# Patient Record
Sex: Male | Born: 1950 | Race: Black or African American | Hispanic: No | Smoking: Former smoker
Health system: Southern US, Community
[De-identification: ages and names within clinical notes are randomized; demographics above are authoritative.]

## PROBLEM LIST (undated history)

## (undated) DIAGNOSIS — N189 Chronic kidney disease, unspecified: Secondary | ICD-10-CM

## (undated) DIAGNOSIS — J189 Pneumonia, unspecified organism: Secondary | ICD-10-CM

## (undated) DIAGNOSIS — E7800 Pure hypercholesterolemia, unspecified: Secondary | ICD-10-CM

## (undated) DIAGNOSIS — R0600 Dyspnea, unspecified: Secondary | ICD-10-CM

## (undated) DIAGNOSIS — R519 Headache, unspecified: Secondary | ICD-10-CM

## (undated) DIAGNOSIS — I10 Essential (primary) hypertension: Secondary | ICD-10-CM

## (undated) DIAGNOSIS — J449 Chronic obstructive pulmonary disease, unspecified: Secondary | ICD-10-CM

## (undated) DIAGNOSIS — M1990 Unspecified osteoarthritis, unspecified site: Secondary | ICD-10-CM

## (undated) DIAGNOSIS — E785 Hyperlipidemia, unspecified: Secondary | ICD-10-CM

## (undated) HISTORY — PX: OTHER SURGICAL HISTORY: SHX169

---

## 2018-03-14 ENCOUNTER — Encounter: Payer: Self-pay | Admitting: *Deleted

## 2018-03-17 ENCOUNTER — Ambulatory Visit: Payer: 59 | Admitting: Anesthesiology

## 2018-03-17 ENCOUNTER — Encounter
Admission: RE | Disposition: A | Discharge status: Home / Self Care | Payer: Self-pay | Source: Ambulatory Visit | Attending: Unknown Physician Specialty

## 2018-03-17 ENCOUNTER — Encounter: Payer: Self-pay | Admitting: Student

## 2018-03-17 ENCOUNTER — Ambulatory Visit
Admission: RE | Admit: 2018-03-17 | Discharge: 2018-03-17 | Disposition: A | Discharge status: Home / Self Care | Payer: 59 | Source: Ambulatory Visit | Attending: Unknown Physician Specialty | Admitting: Unknown Physician Specialty

## 2018-03-17 ENCOUNTER — Other Ambulatory Visit: Payer: Self-pay

## 2018-03-17 DIAGNOSIS — Z800 Family history of malignant neoplasm of digestive organs: Secondary | ICD-10-CM | POA: Diagnosis not present

## 2018-03-17 DIAGNOSIS — K640 First degree hemorrhoids: Secondary | ICD-10-CM | POA: Insufficient documentation

## 2018-03-17 DIAGNOSIS — Z1211 Encounter for screening for malignant neoplasm of colon: Secondary | ICD-10-CM | POA: Diagnosis not present

## 2018-03-17 DIAGNOSIS — E785 Hyperlipidemia, unspecified: Secondary | ICD-10-CM | POA: Diagnosis not present

## 2018-03-17 DIAGNOSIS — K635 Polyp of colon: Secondary | ICD-10-CM | POA: Insufficient documentation

## 2018-03-17 DIAGNOSIS — Z7951 Long term (current) use of inhaled steroids: Secondary | ICD-10-CM | POA: Insufficient documentation

## 2018-03-17 DIAGNOSIS — D123 Benign neoplasm of transverse colon: Secondary | ICD-10-CM | POA: Diagnosis not present

## 2018-03-17 DIAGNOSIS — F17210 Nicotine dependence, cigarettes, uncomplicated: Secondary | ICD-10-CM | POA: Insufficient documentation

## 2018-03-17 DIAGNOSIS — D122 Benign neoplasm of ascending colon: Secondary | ICD-10-CM | POA: Diagnosis not present

## 2018-03-17 HISTORY — DX: Hyperlipidemia, unspecified: E78.5

## 2018-03-17 HISTORY — PX: COLONOSCOPY WITH PROPOFOL: SHX5780

## 2018-03-17 LAB — URINE DRUG SCREEN, QUALITATIVE (ARMC ONLY)
Amphetamines, Ur Screen: NOT DETECTED
Benzodiazepine, Ur Scrn: NOT DETECTED
Cannabinoid 50 Ng, Ur ~~LOC~~: POSITIVE — AB
Cocaine Metabolite,Ur ~~LOC~~: NOT DETECTED
MDMA (Ecstasy)Ur Screen: NOT DETECTED
Methadone Scn, Ur: NOT DETECTED
Opiate, Ur Screen: NOT DETECTED
PHENCYCLIDINE (PCP) UR S: NOT DETECTED
Tricyclic, Ur Screen: NOT DETECTED

## 2018-03-17 SURGERY — COLONOSCOPY WITH PROPOFOL
Anesthesia: General

## 2018-03-17 MED ORDER — MIDAZOLAM HCL 5 MG/5ML IJ SOLN
INTRAMUSCULAR | Status: DC | PRN
  Administered 2018-03-17: 2 mg via INTRAVENOUS

## 2018-03-17 MED ORDER — PROPOFOL 500 MG/50ML IV EMUL
INTRAVENOUS | Status: AC
Start: 2018-03-17 — End: ?
  Filled 2018-03-17: qty 50

## 2018-03-17 MED ORDER — SODIUM CHLORIDE 0.9 % IV SOLN
INTRAVENOUS | Status: DC
  Administered 2018-03-17: 10:00:00 via INTRAVENOUS

## 2018-03-17 MED ORDER — FENTANYL CITRATE (PF) 100 MCG/2ML IJ SOLN
INTRAMUSCULAR | Status: AC
  Filled 2018-03-17: qty 2

## 2018-03-17 MED ORDER — LIDOCAINE HCL (PF) 2 % IJ SOLN
INTRAMUSCULAR | Status: DC | PRN
  Administered 2018-03-17: 80 mg

## 2018-03-17 MED ORDER — SODIUM CHLORIDE 0.9 % IV SOLN: INTRAVENOUS | Status: DC

## 2018-03-17 MED ORDER — LIDOCAINE HCL (PF) 2 % IJ SOLN
INTRAMUSCULAR | Status: AC
  Filled 2018-03-17: qty 10

## 2018-03-17 MED ORDER — FENTANYL CITRATE (PF) 100 MCG/2ML IJ SOLN
INTRAMUSCULAR | Status: DC | PRN
  Administered 2018-03-17 (×2): 25 ug via INTRAVENOUS
  Administered 2018-03-17: 50 ug via INTRAVENOUS

## 2018-03-17 MED ORDER — PROPOFOL 10 MG/ML IV BOLUS
INTRAVENOUS | Status: DC | PRN
  Administered 2018-03-17: 20 mg via INTRAVENOUS
  Administered 2018-03-17: 30 mg via INTRAVENOUS
  Administered 2018-03-17: 20 mg via INTRAVENOUS

## 2018-03-17 MED ORDER — MIDAZOLAM HCL 2 MG/2ML IJ SOLN
INTRAMUSCULAR | Status: AC
  Filled 2018-03-17: qty 2

## 2018-03-17 MED ORDER — PROPOFOL 500 MG/50ML IV EMUL
INTRAVENOUS | Status: DC | PRN
  Administered 2018-03-17: 50 ug/kg/min via INTRAVENOUS

## 2018-03-17 NOTE — H&P (Signed)
Primary Care Physician:  Antionette Char, MD Primary Gastroenterologist:  Dr. Vira Agar  Pre-Procedure History & Physical: HPI:  Melvin Carney is a 67 y.o. male is here for an colonoscopy.  For family history of colon cancer in sister.   Past Medical History:  Diagnosis Date  . Hyperlipidemia     Past Surgical History:  Procedure Laterality Date  . Right Knee Ligament Repair      Prior to Admission medications   Medication Sig Start Date End Date Taking? Authorizing Provider  fluticasone (FLONASE) 50 MCG/ACT nasal spray Place into both nostrils daily.   Yes [provider]  pravastatin (PRAVACHOL) 40 MG tablet Take 40 mg by mouth daily.   Yes [provider]    Allergies as of 02/06/2018  . (Not on File)    History reviewed. No pertinent family history.  Social History   Socioeconomic History  . Marital status: Married    Spouse name: Not on file  . Number of children: Not on file  . Years of education: Not on file  . Highest education level: Not on file  Occupational History  . Not on file  Social Needs  . Financial resource strain: Not on file  . Food insecurity:    Worry: Not on file    Inability: Not on file  . Transportation needs:    Medical: Not on file    Non-medical: Not on file  Tobacco Use  . Smoking status: Current Every Day Smoker    Packs/day: 0.50    Years: 50.00    Pack years: 25.00    Types: Cigarettes  . Smokeless tobacco: Never Used  Substance and Sexual Activity  . Alcohol use: Yes    Comment: 7.2 oz of  alcohol/week  . Drug use: Yes    Types: Marijuana  . Sexual activity: Not on file  Lifestyle  . Physical activity:    Days per week: Not on file    Minutes per session: Not on file  . Stress: Not on file  Relationships  . Social connections:    Talks on phone: Not on file    Gets together: Not on file    Attends religious service: Not on file    Active member of club or organization: Not on file    Attends  meetings of clubs or organizations: Not on file    Relationship status: Not on file  . Intimate partner violence:    Fear of current or ex partner: Not on file    Emotionally abused: Not on file    Physically abused: Not on file    Forced sexual activity: Not on file  Other Topics Concern  . Not on file  Social History Narrative  . Not on file    Review of Systems: See HPI, otherwise negative ROS  Physical Exam: BP (!) 137/91   Pulse 64   Temp 97.8 F (36.6 C) (Tympanic)   Resp 16   Ht 5\' 9"  (1.753 m)   Wt 73.5 kg (162 lb)   SpO2 98%   BMI 23.92 kg/m  General:   Alert,  pleasant and cooperative in NAD Head:  Normocephalic and atraumatic. Neck:  Supple; no masses or thyromegaly. Lungs:  Clear throughout to auscultation.    Heart:  Regular rate and rhythm. Abdomen:  Soft, nontender and nondistended. Normal bowel sounds, without guarding, and without rebound.   Neurologic:  Alert and  oriented x4;  grossly normal neurologically.  Impression/Plan: Graylon Good  is here for an colonoscopy to be performed for FH colon cancer is sister.  Risks, benefits, limitations, and alternatives regarding  colonoscopy have been reviewed with the patient.  Questions have been answered.  All parties agreeable.   Gaylyn Cheers, MD  03/17/2018, 10:26 AM

## 2018-03-17 NOTE — Anesthesia Post-op Follow-up Note (Signed)
Anesthesia QCDR form completed.        

## 2018-03-17 NOTE — Op Note (Signed)
[facility] Gastroenterology Patient Name: Melvin Carney Procedure Date: 03/17/2018 10:03 AM MRN: [id number] Account #: [id number] Date of Birth: 12-16-1950 Admit Type: Outpatient Age: 67 Room: [facility] ENDO ROOM 3 Gender: Male Note Status: Finalized Procedure:            Colonoscopy Indications:          Screening in patient at increased risk: Family history                        of 1st-degree relative with colorectal cancer Providers:            Manya Silvas, MD Medicines:            Propofol per Anesthesia Complications:        No immediate complications. Procedure:            Pre-Anesthesia Assessment:                       - After reviewing the risks and benefits, the patient                        was deemed in satisfactory condition to undergo the                        procedure.                       - After reviewing the risks and benefits, the patient                        was deemed in satisfactory condition to undergo the                        procedure.                       After obtaining informed consent, the colonoscope was                        passed under direct vision. Throughout the procedure,                        the patient's blood pressure, pulse, and oxygen                        saturations were monitored continuously. The was                        introduced through the anus and advanced to the the                        cecum, identified by appendiceal orifice and ileocecal                        valve. The colonoscopy was performed without                        difficulty. The patient tolerated the procedure well.                        The quality of the bowel preparation was excellent. Findings:  Two sessile polyps were found in the ascending colon. The polyps were       diminutive in size. These polyps were removed with a jumbo cold forceps.       Resection and retrieval were complete.      A diminutive polyp was  found in the hepatic flexure. The polyp was       sessile. The polyp was removed with a jumbo cold forceps. Resection and       retrieval were complete.      Two sessile polyps were found in the hepatic flexure. The polyps were       diminutive in size. These polyps were removed with a hot snare.       Resection and retrieval were complete.      Three sessile polyps were found in the recto-sigmoid colon. The polyps       were diminutive in size. These polyps were removed with a jumbo cold       forceps. Resection and retrieval were complete.      Internal hemorrhoids were found during endoscopy. The hemorrhoids were       small and Grade I (internal hemorrhoids that do not prolapse).      The exam was otherwise without abnormality. Impression:           - Two diminutive polyps in the ascending colon, removed                        with a jumbo cold forceps. Resected and retrieved.                       - One diminutive polyp at the hepatic flexure, removed                        with a jumbo cold forceps. Resected and retrieved.                       - Two diminutive polyps at the hepatic flexure, removed                        with a hot snare. Resected and retrieved.                       - Three diminutive polyps at the recto-sigmoid colon,                        removed with a jumbo cold forceps. Resected and                        retrieved.                       - Internal hemorrhoids.                       - The examination was otherwise normal. Recommendation:       - Await pathology results. Manya Silvas, MD 03/17/2018 11:12:09 AM This report has been signed electronically. Number of Addenda: 0 Note Initiated On: 03/17/2018 10:03 AM Scope Withdrawal Time: 0 hours 26 minutes 35 seconds  Total Procedure Duration: 0 hours 30 minutes 31 seconds       [facility]

## 2018-03-17 NOTE — Transfer of Care (Signed)
Immediate Anesthesia Transfer of Care Note  Patient: Melvin Carney  Procedure(s) Performed: COLONOSCOPY WITH PROPOFOL (N/A )  Patient Location: PACU  Anesthesia Type:General  Level of Consciousness: sedated  Airway & Oxygen Therapy: Patient Spontanous Breathing and Patient connected to nasal cannula oxygen  Post-op Assessment: Report given to RN and Post -op Vital signs reviewed and stable  Post vital signs: Reviewed and stable  Last Vitals:  Vitals Value Taken Time  BP    Temp    Pulse 58 03/17/2018 11:09 AM  Resp 20 03/17/2018 11:09 AM  SpO2 100 % 03/17/2018 11:09 AM  Vitals shown include unvalidated device data.  Last Pain:  Vitals:   03/17/18 0938  TempSrc: Tympanic  PainSc: 0-No pain         Complications: No apparent anesthesia complications

## 2018-03-17 NOTE — Anesthesia Postprocedure Evaluation (Signed)
Anesthesia Post Note  Patient: Melvin Carney  Procedure(s) Performed: COLONOSCOPY WITH PROPOFOL (N/A )  Patient location during evaluation: Endoscopy Anesthesia Type: General Level of consciousness: awake and alert Pain management: pain level controlled Vital Signs Assessment: post-procedure vital signs reviewed and stable Respiratory status: spontaneous breathing, nonlabored ventilation, respiratory function stable and patient connected to nasal cannula oxygen Cardiovascular status: blood pressure returned to baseline and stable Postop Assessment: no apparent nausea or vomiting Anesthetic complications: no     Last Vitals:  Vitals:   03/17/18 1130 03/17/18 1140  BP: (!) 150/90 (!) 149/81  Pulse: (!) 54 (!) 54  Resp: 20 (!) 22  Temp:    SpO2: 100% 100%    Last Pain:  Vitals:   03/17/18 1100  TempSrc: Tympanic  PainSc:                  Martha Clan

## 2018-03-17 NOTE — Anesthesia Preprocedure Evaluation (Signed)
Anesthesia Evaluation  Patient identified by MRN, date of birth, ID band Patient awake    Reviewed: Allergy & Precautions, H&P , NPO status , Patient's Chart, lab work & pertinent test results, reviewed documented beta blocker date and time   History of Anesthesia Complications Negative for: history of anesthetic complications  Airway Mallampati: I  TM Distance: >3 FB Neck ROM: full    Dental  (+) Edentulous Upper, Edentulous Lower, Dental Advidsory Given   Pulmonary neg shortness of breath, neg sleep apnea, neg COPD, neg recent URI, Current Smoker,           Cardiovascular Exercise Tolerance: Good negative cardio ROS       Neuro/Psych negative neurological ROS  negative psych ROS   GI/Hepatic negative GI ROS, Neg liver ROS,   Endo/Other  negative endocrine ROS  Renal/GU negative Renal ROS  negative genitourinary   Musculoskeletal   Abdominal   Peds  Hematology negative hematology ROS (+)   Anesthesia Other Findings Past Medical History: No date: Hyperlipidemia   Reproductive/Obstetrics negative OB ROS                             Anesthesia Physical Anesthesia Plan  ASA: II  Anesthesia Plan: General   Post-op Pain Management:    Induction: Intravenous  PONV Risk Score and Plan: 1 and Propofol infusion  Airway Management Planned: Nasal Cannula  Additional Equipment:   Intra-op Plan:   Post-operative Plan:   Informed Consent: I have reviewed the patients History and Physical, chart, labs and discussed the procedure including the risks, benefits and alternatives for the proposed anesthesia with the patient or authorized representative who has indicated his/her understanding and acceptance.   Dental Advisory Given  Plan Discussed with: Anesthesiologist, CRNA and Surgeon  Anesthesia Plan Comments:         Anesthesia Quick Evaluation

## 2018-03-18 LAB — SURGICAL PATHOLOGY

## 2018-03-19 ENCOUNTER — Encounter: Payer: Self-pay | Admitting: Unknown Physician Specialty

## 2020-01-28 ENCOUNTER — Other Ambulatory Visit (HOSPITAL_COMMUNITY): Payer: Self-pay | Admitting: Family Medicine

## 2020-01-28 DIAGNOSIS — Z122 Encounter for screening for malignant neoplasm of respiratory organs: Secondary | ICD-10-CM

## 2020-01-28 DIAGNOSIS — Z720 Tobacco use: Secondary | ICD-10-CM

## 2020-05-02 ENCOUNTER — Ambulatory Visit (HOSPITAL_COMMUNITY)
Admission: RE | Admit: 2020-05-02 | Discharge: 2020-05-02 | Disposition: A | Discharge status: Home / Self Care | Payer: 59 | Source: Ambulatory Visit | Attending: Family Medicine | Admitting: Family Medicine

## 2020-05-02 ENCOUNTER — Other Ambulatory Visit: Payer: Self-pay

## 2020-05-02 DIAGNOSIS — Z720 Tobacco use: Secondary | ICD-10-CM | POA: Diagnosis present

## 2020-05-02 DIAGNOSIS — Z122 Encounter for screening for malignant neoplasm of respiratory organs: Secondary | ICD-10-CM | POA: Diagnosis not present

## 2020-09-06 ENCOUNTER — Ambulatory Visit: Payer: 59 | Attending: Family Medicine

## 2020-09-06 ENCOUNTER — Other Ambulatory Visit: Payer: Self-pay

## 2020-09-06 VITALS — BP 141/86

## 2020-09-06 DIAGNOSIS — G8929 Other chronic pain: Secondary | ICD-10-CM | POA: Diagnosis present

## 2020-09-06 DIAGNOSIS — M25512 Pain in left shoulder: Secondary | ICD-10-CM | POA: Diagnosis not present

## 2020-09-06 DIAGNOSIS — M25511 Pain in right shoulder: Secondary | ICD-10-CM | POA: Insufficient documentation

## 2020-09-06 NOTE — Therapy (Signed)
[facility] [address] [place], [address] Phone: [phone]   Fax:  [phone]  Physical Therapy Evaluation  Patient Details  Name: Melvin Carney MRN: [id number] Date of Birth: 1951/04/01 Referring Provider (PT): Curtis Sites MD    Encounter Date: 09/06/2020   PT End of Session - 09/06/20 1100    Visit Number 1    Number of Visits 16    Date for PT Re-Evaluation 11/29/20    Authorization Type [employer] MCR    Authorization Time Period 09/06/20-11/29/20    PT Start Time 1000    PT Stop Time 1058    PT Time Calculation (min) 58 min    Activity Tolerance Patient limited by pain;Patient tolerated treatment well;Other (comment)   kinesiophobia   Behavior During Therapy WFL for tasks assessed/performed           Past Medical History:  Diagnosis Date  . Hyperlipidemia     Past Surgical History:  Procedure Laterality Date  . COLONOSCOPY WITH PROPOFOL N/A 03/17/2018   Procedure: COLONOSCOPY WITH PROPOFOL;  Surgeon: Manya Silvas, MD;  Location: [facility] ENDOSCOPY;  Service: Endoscopy;  Laterality: N/A;  . Right Knee Ligament Repair      Vitals:   09/06/20 1000  BP: (!) 141/86  SpO2: 100%      Subjective Assessment - 09/06/20 1001    Subjective Melvin Carney comes to OPPT for bilateral shoulder pain on-going for 2-3 years, progressively worse. Pt reports pain, weakness, and limited ROM. Pt denies any imaging, never seen by orthopedics. Pain at eval includes: Rt shoulder 9/10; Left shoulder 7/10. Prescribed pain meds don't work; tylenol and ibuprofen have never helped. Pt reports some paresthesias in right hand that a started recently, lasts about 5 minutes. Pt denies any frank weakness of the hands, denies LUE paresthesias.    Pertinent History Smoker    Limitations Other (comment)   sleeping   How long can you sit comfortably? No related    How long can you stand comfortably? No related    How  long can you walk comfortably? No related    Diagnostic tests None    Patient Stated Goals resolve pain, return to lifting things for IADL/work    Currently in Pain? Yes    Pain Score 8     Pain Location Shoulder    Pain Orientation Right    Pain Type Chronic pain    Pain Frequency Constant    Multiple Pain Sites Yes    Pain Score 7    Pain Location Shoulder    Pain Orientation Left    Pain Type Chronic pain              [facility] PT Assessment - 09/06/20 0001      Assessment   Medical Diagnosis Bilat shoulder pain/stiffness    Referring Provider (PT) Curtis Sites MD     Onset Date/Surgical Date --   ~2-3 years ago;    Hand Dominance Right    Prior Therapy None      Precautions   Precautions None      Balance Screen   Has the patient fallen in the past 6 months No    Has the patient had a decrease in activity level because of a fear of falling?  No    Is the patient reluctant to leave their home because of a fear of falling?  No      Home Environment  Living Environment Private residence    Living Arrangements Alone    Available Help at Discharge --   Mother in [place]; Sister in [place]; Sister in [place] to enter    Entrance Stairs-Number of Steps 4    Entrance Stairs-Rails Right    [place] One level      Prior Function   Level of Independence Independent    Vocation Part time employment   seasonal employeement at the farm   Leisure Likes to travel, go to [place] to visit family      Observation/Other Assessments   Focus on Therapeutic Outcomes (FOTO)  57      ROM / Strength   AROM / PROM / Strength AROM;PROM;Strength      AROM   AROM Assessment Site Shoulder   Pain with all movements, mostly at end range   Right/Left Shoulder Right;Left    Right Shoulder Flexion 96 Degrees    Right Shoulder ABduction 76 Degrees    Right Shoulder Internal Rotation --   T11   Right Shoulder External Rotation --    C7   Left Shoulder Flexion 97 Degrees    Left Shoulder ABduction 86 Degrees    Left Shoulder Internal Rotation --   T9   Left Shoulder External Rotation --   C7     PROM   PROM Assessment Site Shoulder;Cervical    Right/Left Shoulder Right;Left    Right Shoulder Flexion 115 Degrees    Right Shoulder ABduction 103 Degrees    Right Shoulder Internal Rotation 30 Degrees    Right Shoulder External Rotation 60 Degrees    Left Shoulder Flexion 151 Degrees    Left Shoulder ABduction 140 Degrees    Left Shoulder Internal Rotation 40 Degrees   pain at end range    Left Shoulder External Rotation 80 Degrees    Cervical Extension 30    Cervical - Right Rotation 51   shoulder pain   Cervical - Left Rotation 50   shoulder pain     Strength   Overall Strength Unable to assess   too much pain/guarding   Overall Strength Comments Elbows 5/5Lt; 4+/5 Rt; grips WNL; shoulder ER: 5/5 Lt4/5 Rt    Strength Assessment Site Shoulder    Right/Left Shoulder Right;Left            Objective measurements completed on examination: See above findings.       [facility] Adult PT Treatment/Exercise - 09/06/20 0001      Exercises   Exercises Shoulder      Shoulder Exercises: Seated   Elevation AROM;10 reps;Both    Retraction AROM;Both;10 reps      Shoulder Exercises: ROM/Strengthening   Other ROM/Strengthening Exercises Flexion table slides 1x20 bilat     Other ROM/Strengthening Exercises ABDCT walkouts with T-Rex straps 20x Left; 15x Right                     PT Education - 09/06/20 1059    Education Details Explained how current kinesiophobia is first hurdle in rehab process.    Person(s) Educated Patient    Methods Explanation    Comprehension Verbalized understanding;Need further instruction            PT Short Term Goals - 09/06/20 1115      PT SHORT TERM GOAL #1   Title Pt will report decreased worse pain in bilat shoulder <7/10 bilat.  Baseline AT eval 9/10 Rt; 7/10 Lt     Time 4    Period Weeks    Status New    Target Date 10/04/20      PT SHORT TERM GOAL #2   Title Pt to demontrate improved functional activity tolerance AEB FOTO> 60.    Time 4    Period Weeks    Status New    Target Date 10/04/20             PT Long Term Goals - 09/06/20 1117      PT LONG TERM GOAL #1   Title Pt to demonstrate improved functional activity tolerance AED FOTO>65.    Time 8    Period Weeks    Status New    Target Date 11/01/20      PT LONG TERM GOAL #2   Title Pt to demonstrate improved aggregate shoulder P/ROM >375 degrees Rt; 425 degrees left to improve tolerance to dressing and housework.    Baseline (Flexion+Abduction+ER+IR) At eval: Rt 308 degrees; Lt 411 degrees.    Time 8    Period Weeks    Status New    Target Date 11/01/20      PT LONG TERM GOAL #3   Title Pt to demonstrate improved aggregate shoulder P/ROM >400 degrees Rt; 440 degrees left to improve tolerance to dressing and housework.    Baseline (Flexion+Abduction+ER+IR) At eval: Rt 308 degrees; Lt 411 degrees.    Time 12    Period Weeks    Status New    Target Date 11/29/20      PT LONG TERM GOAL #4   Title Pt to report/demonstrate ability to lift and carry 25lb item over 193ft 10x with breaks as needed to demonstrate ability to grocery shop, perform farm work tasks.    Baseline unable to lift/carry at eval    Time 12    Period Weeks    Status New    Target Date 11/29/20                  Plan - 09/06/20 1104    Clinical Impression Statement Melvin Carney is a 89yoM presenting to OPPT referred by PCP for chronic progressive bilat shoulder pain. Pt has been having difficulty with ADL performance and lifting objects at home/work, significant pain sleeping. Examination reveals severe ROM restriction bilaterally, significant guarding and kinesiophobia, end-range pain. Pt also has limited cervical ROM with causes pain in UT bilat, but head position does not change pain symptoms in  BUE during ROM testing. Pt will benefit from skilled PT intervention to address pain, ROM restriction, weakness in order to restore    Personal Factors and Comorbidities Age;Behavior Pattern;Comorbidity 1;Education;Time since onset of injury/illness/exacerbation;Social Background    Comorbidities Smoker    Examination-Activity Limitations Bathing;Reach Overhead;Carry;Dressing;Hygiene/Grooming    Examination-Participation Restrictions Cleaning;Driving;Yard Work;Occupation    Stability/Clinical Decision Making Stable/Uncomplicated    Clinical Decision Making Moderate    Rehab Potential Fair    PT Frequency 2x / week    PT Duration 12 weeks    PT Treatment/Interventions Electrical Stimulation;Moist Heat;Therapeutic activities;Therapeutic exercise;Neuromuscular re-education;Manual techniques;Patient/family education;Passive range of motion;Dry needling;Joint Manipulations    PT Next Visit Plan Review HEP, add doorframe 4-way shoulder isometrics to HEP    PT Home Exercise Plan At Eval: flexion table slides bilat; scapular elevation, scapular retraction (all TID)    Consulted and Agree with Plan of Care Patient           Patient will benefit  from skilled therapeutic intervention in order to improve the following deficits and impairments:  Decreased range of motion, Decreased endurance, Decreased activity tolerance, Decreased knowledge of precautions, Decreased balance, Decreased knowledge of use of DME, Decreased cognition, Decreased mobility, Decreased strength, Postural dysfunction, Impaired flexibility, Hypomobility, Impaired perceived functional ability, Impaired UE functional use  Visit Diagnosis: Chronic left shoulder pain  Chronic right shoulder pain     Problem List There are no problems to display for this patient. 11:33 AM, 09/06/20 Melvin Carney, PT, DPT Physical Therapist - [place] [phone] (Office)    Gautam Langhorst C 09/06/2020, 11:32 AM  [facility] [address] [place], [address] Phone: [phone]   Fax:  [phone]  Name: Dorthy Hustead MRN: [id number] Date of Birth: [birth year]

## 2020-09-08 ENCOUNTER — Ambulatory Visit: Payer: 59

## 2020-09-08 ENCOUNTER — Other Ambulatory Visit: Payer: Self-pay

## 2020-09-08 DIAGNOSIS — M25512 Pain in left shoulder: Secondary | ICD-10-CM | POA: Diagnosis not present

## 2020-09-08 DIAGNOSIS — G8929 Other chronic pain: Secondary | ICD-10-CM

## 2020-09-08 NOTE — Therapy (Signed)
[facility] [address] [place], [address] Phone: [phone]   Fax:  [phone]  Physical Therapy Treatment  Patient Details  Name: Melvin Carney MRN: [id number] Date of Birth: [birth year] Referring Provider (PT): Curtis Sites MD    Encounter Date: 09/08/2020   PT End of Session - 09/08/20 0958    Visit Number 2    Number of Visits 16    Date for PT Re-Evaluation 11/29/20    Authorization Type UHC MCR    Authorization Time Period 09/06/20-11/29/20    PT Start Time 0815    PT Stop Time 0900    PT Time Calculation (min) 45 min    Activity Tolerance Patient limited by pain;Patient tolerated treatment well;Other (comment)   kinesiophobia   Behavior During Therapy WFL for tasks assessed/performed           Past Medical History:  Diagnosis Date  . Hyperlipidemia     Past Surgical History:  Procedure Laterality Date  . COLONOSCOPY WITH PROPOFOL N/A 03/17/2018   Procedure: COLONOSCOPY WITH PROPOFOL;  Surgeon: Manya Silvas, MD;  Location: [facility] ENDOSCOPY;  Service: Endoscopy;  Laterality: N/A;  . Right Knee Ligament Repair      There were no vitals filed for this visit.   Subjective Assessment - 09/08/20 0839    Subjective Patient reports no major change since the previous session. Patient states he's been performing his exercises.    Pertinent History Smoker    Limitations Other (comment)   sleeping   How long can you sit comfortably? No related    How long can you stand comfortably? No related    How long can you walk comfortably? No related    Diagnostic tests None    Patient Stated Goals resolve pain, return to lifting things for IADL/work    Currently in Pain? Yes    Pain Score 8     Pain Location Shoulder    Pain Orientation Right    Pain Descriptors / Indicators Aching    Pain Type Chronic pain    Pain Onset More than a month ago    Pain Frequency Intermittent                   TREATMENT Therapeutic Exercise Scapular retraction with OP from therapist in sitting - 2 x 10  Shoulder depression in sitting - x 10  Rows Shoulder flexion - x 10 on L side Shoulder flexion press in sitting--x 10  Shoulder flexion AROM to 90 with dowel in supine - x 10  Hands clasped together shoulder flexion to 90 degrees - x 10   Performed exercises to decrease fear with movement and improve strength      PT Education - 09/08/20 0841    Education Details Rows in sitting with YTB; form/technique with exercise    Person(s) Educated Patient    Methods Explanation;Demonstration    Comprehension Verbalized understanding;Returned demonstration            PT Short Term Goals - 09/06/20 1115      PT SHORT TERM GOAL #1   Title Pt will report decreased worse pain in bilat shoulder <7/10 bilat.    Baseline AT eval 9/10 Rt; 7/10 Lt    Time 4    Period Weeks    Status New    Target Date 10/04/20      PT SHORT TERM GOAL #2   Title Pt to demontrate improved functional  activity tolerance AEB FOTO> 60.    Time 4    Period Weeks    Status New    Target Date 10/04/20             PT Long Term Goals - 09/06/20 1117      PT LONG TERM GOAL #1   Title Pt to demonstrate improved functional activity tolerance AED FOTO>65.    Time 8    Period Weeks    Status New    Target Date 11/01/20      PT LONG TERM GOAL #2   Title Pt to demonstrate improved aggregate shoulder P/ROM >375 degrees Rt; 425 degrees left to improve tolerance to dressing and housework.    Baseline (Flexion+Abduction+ER+IR) At eval: Rt 308 degrees; Lt 411 degrees.    Time 8    Period Weeks    Status New    Target Date 11/01/20      PT LONG TERM GOAL #3   Title Pt to demonstrate improved aggregate shoulder P/ROM >400 degrees Rt; 440 degrees left to improve tolerance to dressing and housework.    Baseline (Flexion+Abduction+ER+IR) At eval: Rt 308 degrees; Lt 411 degrees.    Time 12    Period  Weeks    Status New    Target Date 11/29/20      PT LONG TERM GOAL #4   Title Pt to report/demonstrate ability to lift and carry 25lb item over 147ft 10x with breaks as needed to demonstrate ability to grocery shop, perform farm work tasks.    Baseline unable to lift/carry at eval    Time 12    Period Weeks    Status New    Target Date 11/29/20                 Plan - 09/08/20 6967    Clinical Impression Statement Patient with increased fear of movement especially with shoulder flexion and scapular retraction/rows with exercises performed. Patient able to performshoulder flexion to 150 with shoulder press, a signficant increase compared to an erect UE. This indicates Kinesiophobia as well as a decrease in strength. Patient will benefit from further skilled therapy to return to prior level of function.    Personal Factors and Comorbidities Age;Behavior Pattern;Comorbidity 1;Education;Time since onset of injury/illness/exacerbation;Social Background    Comorbidities Smoker    Examination-Activity Limitations Bathing;Reach Overhead;Carry;Dressing;Hygiene/Grooming    Examination-Participation Restrictions Cleaning;Driving;Yard Work;Occupation    Stability/Clinical Decision Making Stable/Uncomplicated    Rehab Potential Fair    PT Frequency 2x / week    PT Duration 12 weeks    PT Treatment/Interventions Electrical Stimulation;Moist Heat;Therapeutic activities;Therapeutic exercise;Neuromuscular re-education;Manual techniques;Patient/family education;Passive range of motion;Dry needling;Joint Manipulations    PT Next Visit Plan Review HEP, add doorframe 4-way shoulder isometrics to HEP    PT Home Exercise Plan At Eval: flexion table slides bilat; scapular elevation, scapular retraction (all TID)    Consulted and Agree with Plan of Care Patient           Patient will benefit from skilled therapeutic intervention in order to improve the following deficits and impairments:  Decreased  range of motion,Decreased endurance,Decreased activity tolerance,Decreased knowledge of precautions,Decreased balance,Decreased knowledge of use of DME,Decreased cognition,Decreased mobility,Decreased strength,Postural dysfunction,Impaired flexibility,Hypomobility,Impaired perceived functional ability,Impaired UE functional use  Visit Diagnosis: Chronic left shoulder pain  Chronic right shoulder pain     Problem List There are no problems to display for this patient.   Blythe Stanford, PT DPT 09/08/2020, 10:04 AM  [facility] [address], [place], [address] Phone: [phone]   Fax:  [phone]  Name: Garin Mata MRN: [id number] Date of Birth: 1951/01/15

## 2020-09-13 ENCOUNTER — Other Ambulatory Visit: Payer: Self-pay

## 2020-09-13 ENCOUNTER — Ambulatory Visit: Payer: 59

## 2020-09-13 DIAGNOSIS — M25511 Pain in right shoulder: Secondary | ICD-10-CM

## 2020-09-13 DIAGNOSIS — G8929 Other chronic pain: Secondary | ICD-10-CM

## 2020-09-13 DIAGNOSIS — M25512 Pain in left shoulder: Secondary | ICD-10-CM | POA: Diagnosis not present

## 2020-09-13 NOTE — Therapy (Signed)
[facility] [address] [place], [address] Phone: [phone]   Fax:  [phone]  Physical Therapy Treatment  Patient Details  Name: Melvin Carney MRN: [id number] Date of Birth: 03/05/1951 Referring Provider (PT): Curtis Sites MD    Encounter Date: 09/13/2020   PT End of Session - 09/13/20 0943    Visit Number 3    Number of Visits 16    Date for PT Re-Evaluation 11/29/20    Authorization Type UHC MCR    Authorization Time Period 09/06/20-11/29/20    PT Start Time 0900    PT Stop Time 0938    PT Time Calculation (min) 38 min    Activity Tolerance Patient limited by pain;Patient tolerated treatment well;Other (comment)   kinesiophobia   Behavior During Therapy WFL for tasks assessed/performed           Past Medical History:  Diagnosis Date   Hyperlipidemia     Past Surgical History:  Procedure Laterality Date   COLONOSCOPY WITH PROPOFOL N/A 03/17/2018   Procedure: COLONOSCOPY WITH PROPOFOL;  Surgeon: Manya Silvas, MD;  Location: [facility] ENDOSCOPY;  Service: Endoscopy;  Laterality: N/A;   Right Knee Ligament Repair      There were no vitals filed for this visit.   Subjective Assessment - 09/13/20 0905    Subjective Patient states his pain in his shoulder is improving and isable to move it more.    Pertinent History Smoker    Limitations Other (comment)   sleeping   How long can you sit comfortably? No related    How long can you stand comfortably? No related    How long can you walk comfortably? No related    Diagnostic tests None    Patient Stated Goals resolve pain, return to lifting things for IADL/work    Currently in Pain? Yes    Pain Score 5     Pain Location Shoulder    Pain Orientation Right    Pain Descriptors / Indicators Aching    Pain Type Chronic pain    Pain Onset More than a month ago    Pain Frequency Intermittent              TREATMENT Therapeutic  Exercise  Shoulder Dowel lifts overhead with B UEs - 2 x 10 Standing shoulder ER with YTB - 2 x 10  Shoulder extension in standing with YTB and retraction - 2 x 10  Shoulder presses in standing with 1# B - 2 x 10 ULTT1 - x 5 with OP glides Median nerve glides - 2 x 10  Push ups on wall - 2 x 10  Standing shoulder IR in standing with YTB - x 20   Performed exercises to improve strength and endurance    PT Education - 09/13/20 0931    Education Details Form/technique with exercise; added shoulder extension, B shoulder ER, and median nerve glides to HEP    Person(s) Educated Patient    Methods Explanation;Demonstration    Comprehension Verbalized understanding;Returned demonstration            PT Short Term Goals - 09/06/20 1115      PT SHORT TERM GOAL #1   Title Pt will report decreased worse pain in bilat shoulder <7/10 bilat.    Baseline AT eval 9/10 Rt; 7/10 Lt    Time 4    Period Weeks    Status New    Target Date 10/04/20  PT SHORT TERM GOAL #2   Title Pt to demontrate improved functional activity tolerance AEB FOTO> 60.    Time 4    Period Weeks    Status New    Target Date 10/04/20             PT Long Term Goals - 09/06/20 1117      PT LONG TERM GOAL #1   Title Pt to demonstrate improved functional activity tolerance AED FOTO>65.    Time 8    Period Weeks    Status New    Target Date 11/01/20      PT LONG TERM GOAL #2   Title Pt to demonstrate improved aggregate shoulder P/ROM >375 degrees Rt; 425 degrees left to improve tolerance to dressing and housework.    Baseline (Flexion+Abduction+ER+IR) At eval: Rt 308 degrees; Lt 411 degrees.    Time 8    Period Weeks    Status New    Target Date 11/01/20      PT LONG TERM GOAL #3   Title Pt to demonstrate improved aggregate shoulder P/ROM >400 degrees Rt; 440 degrees left to improve tolerance to dressing and housework.    Baseline (Flexion+Abduction+ER+IR) At eval: Rt 308 degrees; Lt 411 degrees.     Time 12    Period Weeks    Status New    Target Date 11/29/20      PT LONG TERM GOAL #4   Title Pt to report/demonstrate ability to lift and carry 25lb item over 145ft 10x with breaks as needed to demonstrate ability to grocery shop, perform farm work tasks.    Baseline unable to lift/carry at eval    Time 12    Period Weeks    Status New    Target Date 11/29/20                 Plan - 09/13/20 0943    Clinical Impression Statement Patient with significant improvement during today's session, able to move his shoulder throughout a greater AROM with little to no pain noted. Patient with endurance difficulties as he fatigued consistently around repetitions around number 10. Patient with positive ULTT1 impacting median nerve involvement. Patient will benefit from further skilled therapy to return to prior level of function.    Personal Factors and Comorbidities Age;Behavior Pattern;Comorbidity 1;Education;Time since onset of injury/illness/exacerbation;Social Background    Comorbidities Smoker    Examination-Activity Limitations Bathing;Reach Overhead;Carry;Dressing;Hygiene/Grooming    Examination-Participation Restrictions Cleaning;Driving;Yard Work;Occupation    Stability/Clinical Decision Making Stable/Uncomplicated    Rehab Potential Fair    PT Frequency 2x / week    PT Duration 12 weeks    PT Treatment/Interventions Electrical Stimulation;Moist Heat;Therapeutic activities;Therapeutic exercise;Neuromuscular re-education;Manual techniques;Patient/family education;Passive range of motion;Dry needling;Joint Manipulations    PT Next Visit Plan Review HEP, add doorframe 4-way shoulder isometrics to HEP    PT Home Exercise Plan At Eval: flexion table slides bilat; scapular elevation, scapular retraction (all TID)    Consulted and Agree with Plan of Care Patient           Patient will benefit from skilled therapeutic intervention in order to improve the following deficits and  impairments:  Decreased range of motion,Decreased endurance,Decreased activity tolerance,Decreased knowledge of precautions,Decreased balance,Decreased knowledge of use of DME,Decreased cognition,Decreased mobility,Decreased strength,Postural dysfunction,Impaired flexibility,Hypomobility,Impaired perceived functional ability,Impaired UE functional use  Visit Diagnosis: Chronic left shoulder pain  Chronic right shoulder pain     Problem List There are no problems to display for this patient.   [place]  Ruthene Methvin, PT DPT 09/13/2020, 9:46 AM  [facility] [address], [place], [address] Phone: [phone]   Fax:  [phone]  Name: Melvin Carney MRN: [id number] Date of Birth: 1951-07-18

## 2020-09-26 ENCOUNTER — Ambulatory Visit: Payer: 59

## 2020-09-28 ENCOUNTER — Ambulatory Visit: Payer: 59

## 2020-09-29 ENCOUNTER — Ambulatory Visit: Payer: 59

## 2020-10-03 ENCOUNTER — Ambulatory Visit: Payer: 59 | Attending: Family Medicine

## 2020-10-03 ENCOUNTER — Other Ambulatory Visit: Payer: Self-pay

## 2020-10-03 DIAGNOSIS — G8929 Other chronic pain: Secondary | ICD-10-CM | POA: Insufficient documentation

## 2020-10-03 DIAGNOSIS — M25512 Pain in left shoulder: Secondary | ICD-10-CM | POA: Insufficient documentation

## 2020-10-03 DIAGNOSIS — M25511 Pain in right shoulder: Secondary | ICD-10-CM | POA: Diagnosis present

## 2020-10-03 NOTE — Therapy (Signed)
[facility] [address], [place], [address] Phone: [phone]   Fax:  [phone]  Physical Therapy Treatment  Patient Details  Name: Melvin Carney MRN: [id number] Date of Birth: [birth year] Referring Provider (PT): Sullivan Lone MD    Encounter Date: 10/03/2020   PT End of Session - 10/03/20 0924    Visit Number 4    Number of Visits 16    Date for PT Re-Evaluation 11/29/20    Authorization Type UHC MCR    Authorization Time Period 09/06/20-11/29/20    PT Start Time 0913    PT Stop Time 0945    PT Time Calculation (min) 32 min    Activity Tolerance Patient limited by pain;Patient tolerated treatment well;Other (comment)   kinesiophobia   Behavior During Therapy WFL for tasks assessed/performed           Past Medical History:  Diagnosis Date  . Hyperlipidemia     Past Surgical History:  Procedure Laterality Date  . COLONOSCOPY WITH PROPOFOL N/A 03/17/2018   Procedure: COLONOSCOPY WITH PROPOFOL;  Surgeon: Scot Jun, MD;  Location: [facility] ENDOSCOPY;  Service: Endoscopy;  Laterality: N/A;  . Right Knee Ligament Repair      There were no vitals filed for this visit.   Subjective Assessment - 10/03/20 0915    Subjective Patient reports his shoulder has been feeling "good" overall. Patient states when his shoulder bothers him he moves it and the pain decreases.    Pertinent History Smoker    Limitations Other (comment)   sleeping   How long can you sit comfortably? No related    How long can you stand comfortably? No related    How long can you walk comfortably? No related    Diagnostic tests None    Patient Stated Goals resolve pain, return to lifting things for IADL/work    Currently in Pain? No/denies    Pain Onset More than a month ago                TREATMENT Therapeutic Exercise Median nerve glides in standing - 2 x 10  B Shoulder ER in standing with RTB - 2 x 15 B  shoulder Press - 3 x 5 5# Shoulder retraction with shoulder extension in standing - 2 x 10 RTB Shoulder ER into shoulder flexion with RTB around wrists - x 4  Seated ball roll outs with physioball in flexion and abduction - x 25 in all directions  Performed exercises to decrease shoulder pain and improve mobility.    -         PT Education - 10/03/20 0922    Education Details form/techniuqe with exercise    Person(s) Educated Patient    Methods Explanation;Demonstration    Comprehension Verbalized understanding;Returned demonstration            PT Short Term Goals - 09/06/20 1115      PT SHORT TERM GOAL #1   Title Pt will report decreased worse pain in bilat shoulder <7/10 bilat.    Baseline AT eval 9/10 Rt; 7/10 Lt    Time 4    Period Weeks    Status New    Target Date 10/04/20      PT SHORT TERM GOAL #2   Title Pt to demontrate improved functional activity tolerance AEB FOTO> 60.    Time 4    Period Weeks    Status New    Target Date 10/04/20  PT Long Term Goals - 09/06/20 1117      PT LONG TERM GOAL #1   Title Pt to demonstrate improved functional activity tolerance AED FOTO>65.    Time 8    Period Weeks    Status New    Target Date 11/01/20      PT LONG TERM GOAL #2   Title Pt to demonstrate improved aggregate shoulder P/ROM >375 degrees Rt; 425 degrees left to improve tolerance to dressing and housework.    Baseline (Flexion+Abduction+ER+IR) At eval: Rt 308 degrees; Lt 411 degrees.    Time 8    Period Weeks    Status New    Target Date 11/01/20      PT LONG TERM GOAL #3   Title Pt to demonstrate improved aggregate shoulder P/ROM >400 degrees Rt; 440 degrees left to improve tolerance to dressing and housework.    Baseline (Flexion+Abduction+ER+IR) At eval: Rt 308 degrees; Lt 411 degrees.    Time 12    Period Weeks    Status New    Target Date 11/29/20      PT LONG TERM GOAL #4   Title Pt to report/demonstrate ability to lift and  carry 25lb item over 146ft 10x with breaks as needed to demonstrate ability to grocery shop, perform farm work tasks.    Baseline unable to lift/carry at eval    Time 12    Period Weeks    Status New    Target Date 11/29/20                 Plan - 10/03/20 0932    Clinical Impression Statement Improvement in shoulder flexion and abduction strength on today's visit, however patient continues to have significant decrease in muscular strength in shoulder ER and poor coordination indicating decreased muscular coordination. Focused on improving coordination with shoulder ER and flexion movements as well as improving strength in both regards. Patient will benefit from further skilled therapy to return to prior level of function.    Personal Factors and Comorbidities Age;Behavior Pattern;Comorbidity 1;Education;Time since onset of injury/illness/exacerbation;Social Background    Comorbidities Smoker    Examination-Activity Limitations Bathing;Reach Overhead;Carry;Dressing;Hygiene/Grooming    Examination-Participation Restrictions Cleaning;Driving;Yard Work;Occupation    Stability/Clinical Decision Making Stable/Uncomplicated    Rehab Potential Fair    PT Frequency 2x / week    PT Duration 12 weeks    PT Treatment/Interventions Electrical Stimulation;Moist Heat;Therapeutic activities;Therapeutic exercise;Neuromuscular re-education;Manual techniques;Patient/family education;Passive range of motion;Dry needling;Joint Manipulations    PT Next Visit Plan Review HEP, add doorframe 4-way shoulder isometrics to HEP    PT Home Exercise Plan At Eval: flexion table slides bilat; scapular elevation, scapular retraction (all TID)    Consulted and Agree with Plan of Care Patient           Patient will benefit from skilled therapeutic intervention in order to improve the following deficits and impairments:  Decreased range of motion,Decreased endurance,Decreased activity tolerance,Decreased knowledge of  precautions,Decreased balance,Decreased knowledge of use of DME,Decreased cognition,Decreased mobility,Decreased strength,Postural dysfunction,Impaired flexibility,Hypomobility,Impaired perceived functional ability,Impaired UE functional use  Visit Diagnosis: Chronic left shoulder pain  Chronic right shoulder pain     Problem List There are no problems to display for this patient.   Blythe Stanford, PT DPT 10/03/2020, 10:58 AM  [facility] [address], [place], [address] Phone: [phone]   Fax:  [phone]  Name: Melvin Carney MRN: [id number] Date of Birth: [birth year]

## 2020-10-05 ENCOUNTER — Ambulatory Visit: Payer: 59

## 2020-10-10 ENCOUNTER — Other Ambulatory Visit: Payer: Self-pay

## 2020-10-10 ENCOUNTER — Ambulatory Visit: Payer: 59

## 2020-10-10 DIAGNOSIS — M25512 Pain in left shoulder: Secondary | ICD-10-CM | POA: Diagnosis not present

## 2020-10-10 DIAGNOSIS — M25511 Pain in right shoulder: Secondary | ICD-10-CM

## 2020-10-10 DIAGNOSIS — G8929 Other chronic pain: Secondary | ICD-10-CM

## 2020-10-11 NOTE — Therapy (Addendum)
[facility] [address], [place], [address] Phone: [phone]   Fax:  [phone]  Physical Therapy Treatment  Patient Details  Name: Melvin Carney MRN: [id number] Date of Birth: 1950-10-15 Referring Provider (PT): Curtis Sites MD    Encounter Date: 10/10/2020   PT End of Session - 10/10/20 0920    Visit Number 5    Number of Visits 16    Date for PT Re-Evaluation 11/29/20    Authorization Type UHC MCR    Authorization Time Period 09/06/20-11/29/20    PT Start Time 0900    PT Stop Time 0945    PT Time Calculation (min) 45 min    Activity Tolerance Patient limited by pain;Patient tolerated treatment well;Other (comment)    Behavior During Therapy WFL for tasks assessed/performed           Past Medical History:  Diagnosis Date  . Hyperlipidemia     Past Surgical History:  Procedure Laterality Date  . COLONOSCOPY WITH PROPOFOL N/A 03/17/2018   Procedure: COLONOSCOPY WITH PROPOFOL;  Surgeon: Manya Silvas, MD;  Location: [facility] ENDOSCOPY;  Service: Endoscopy;  Laterality: N/A;  . Right Knee Ligament Repair      There were no vitals filed for this visit.   Subjective Assessment - 10/10/20 0912    Subjective Patient states he had an increase in pain after lifting a fence over the weekend. Patient reports immense increase in pain.    Pertinent History Smoker    Limitations Other (comment)   sleeping   How long can you sit comfortably? No related    How long can you stand comfortably? No related    How long can you walk comfortably? No related    Diagnostic tests None    Patient Stated Goals resolve pain, return to lifting things for IADL/work    Currently in Pain? Yes    Pain Score 8     Pain Location Back    Pain Orientation Upper    Pain Descriptors / Indicators Aching    Pain Type Chronic pain    Pain Onset More than a month ago    Pain Frequency Intermittent               TREATMENT Therapeutic Exercise Seated ball roll outs with physioball in flexion and abduction - x 20 in all directions Seated thoracic extension with towel behind back - x 3 min  Thoracic rotation in sitting - x 20 Scapular retraction in standing - x 20 B Shoulder ER - x 20 with YTB  Performed exercises to decrease shoulder pain and improve mobility.       PT Education - 10/10/20 0919    Education Details form/technique with exercise    Person(s) Educated Patient    Methods Explanation;Demonstration    Comprehension Verbalized understanding;Returned demonstration            PT Short Term Goals - 09/06/20 1115      PT SHORT TERM GOAL #1   Title Pt will report decreased worse pain in bilat shoulder <7/10 bilat.    Baseline AT eval 9/10 Rt; 7/10 Lt    Time 4    Period Weeks    Status New    Target Date 10/04/20      PT SHORT TERM GOAL #2   Title Pt to demontrate improved functional activity tolerance AEB FOTO> 60.    Time 4    Period Weeks  Status New    Target Date 10/04/20             PT Long Term Goals - 09/06/20 1117      PT LONG TERM GOAL #1   Title Pt to demonstrate improved functional activity tolerance AED FOTO>65.    Time 8    Period Weeks    Status New    Target Date 11/01/20      PT LONG TERM GOAL #2   Title Pt to demonstrate improved aggregate shoulder P/ROM >375 degrees Rt; 425 degrees left to improve tolerance to dressing and housework.    Baseline (Flexion+Abduction+ER+IR) At eval: Rt 308 degrees; Lt 411 degrees.    Time 8    Period Weeks    Status New    Target Date 11/01/20      PT LONG TERM GOAL #3   Title Pt to demonstrate improved aggregate shoulder P/ROM >400 degrees Rt; 440 degrees left to improve tolerance to dressing and housework.    Baseline (Flexion+Abduction+ER+IR) At eval: Rt 308 degrees; Lt 411 degrees.    Time 12    Period Weeks    Status New    Target Date 11/29/20      PT LONG TERM GOAL #4   Title Pt to  report/demonstrate ability to lift and carry 25lb item over 142ft 10x with breaks as needed to demonstrate ability to grocery shop, perform farm work tasks.    Baseline unable to lift/carry at eval    Time 12    Period Weeks    Status New    Target Date 11/29/20                 Plan - 10/11/20 7026    Clinical Impression Statement Performed exercises to address thoracic lumbar mobility with exercises today secondary to increased pain. Patient demonstrates increased guarding along the thoracic extensors and rotators as indicated by increased pain with rotation to the L and extension. Patient's pain seems to be muscular in nature, however educated to follow up with doctor. Patient will benefit from further skilled therapy to return to prior level of function.    Personal Factors and Comorbidities Age;Behavior Pattern;Comorbidity 1;Education;Time since onset of injury/illness/exacerbation;Social Background    Comorbidities Smoker    Examination-Activity Limitations Bathing;Reach Overhead;Carry;Dressing;Hygiene/Grooming    Examination-Participation Restrictions Cleaning;Driving;Yard Work;Occupation    Stability/Clinical Decision Making Stable/Uncomplicated    Rehab Potential Fair    PT Frequency 2x / week    PT Duration 12 weeks    PT Treatment/Interventions Electrical Stimulation;Moist Heat;Therapeutic activities;Therapeutic exercise;Neuromuscular re-education;Manual techniques;Patient/family education;Passive range of motion;Dry needling;Joint Manipulations    PT Next Visit Plan Review HEP, add doorframe 4-way shoulder isometrics to HEP    PT Home Exercise Plan At Eval: flexion table slides bilat; scapular elevation, scapular retraction (all TID)    Consulted and Agree with Plan of Care Patient           Patient will benefit from skilled therapeutic intervention in order to improve the following deficits and impairments:  Decreased range of motion,Decreased endurance,Decreased  activity tolerance,Decreased knowledge of precautions,Decreased balance,Decreased knowledge of use of DME,Decreased cognition,Decreased mobility,Decreased strength,Postural dysfunction,Impaired flexibility,Hypomobility,Impaired perceived functional ability,Impaired UE functional use  Visit Diagnosis: Chronic left shoulder pain  Chronic right shoulder pain     Problem List There are no problems to display for this patient.   Myrene Galas, PT DPT 10/11/2020, 8:37 AM  [facility] [address] [place], [place],  [place] Phone: [phone]   Fax:  [phone]  Name: Melvin Carney MRN: [id number] Date of Birth: 1950/10/22

## 2020-10-12 ENCOUNTER — Other Ambulatory Visit: Payer: Self-pay

## 2020-10-12 ENCOUNTER — Ambulatory Visit: Payer: 59

## 2020-10-12 DIAGNOSIS — M25512 Pain in left shoulder: Secondary | ICD-10-CM

## 2020-10-12 DIAGNOSIS — G8929 Other chronic pain: Secondary | ICD-10-CM

## 2020-10-12 NOTE — Therapy (Signed)
[facility] [address], [place], [address] Phone: [phone]   Fax:  [phone]  Physical Therapy Treatment  Patient Details  Name: Melvin Carney MRN: [id number] Date of Birth: 10-18-1950 Referring Provider (PT): Curtis Sites MD    Encounter Date: 10/12/2020   PT End of Session - 10/12/20 0912    Visit Number 6    Number of Visits 16    Date for PT Re-Evaluation 11/29/20    Authorization Type UHC MCR    Authorization Time Period 09/06/20-11/29/20    PT Start Time 0900    PT Stop Time 0945    PT Time Calculation (min) 45 min    Activity Tolerance Patient limited by pain;Patient tolerated treatment well;Other (comment)    Behavior During Therapy WFL for tasks assessed/performed           Past Medical History:  Diagnosis Date  . Hyperlipidemia     Past Surgical History:  Procedure Laterality Date  . COLONOSCOPY WITH PROPOFOL N/A 03/17/2018   Procedure: COLONOSCOPY WITH PROPOFOL;  Surgeon: Manya Silvas, MD;  Location: [facility] ENDOSCOPY;  Service: Endoscopy;  Laterality: N/A;  . Right Knee Ligament Repair      There were no vitals filed for this visit.   Subjective Assessment - 10/12/20 0906    Subjective Patient with improvement in thoracic pain and states his shoulders have been feeling better.    Pertinent History Smoker    Limitations Other (comment)   sleeping   How long can you sit comfortably? No related    How long can you stand comfortably? No related    How long can you walk comfortably? No related    Diagnostic tests None    Patient Stated Goals resolve pain, return to lifting things for IADL/work    Currently in Pain? No/denies    Pain Onset More than a month ago           TREATMENT Therapeutic Exercise Radial nerve glides in sitting - x 15 Ball push outs in sitting - x 20 [place] [employer] House - Skyscraper - 2 x 10 Shoulder flexion performed B with RTB around wrist into  horizontal abduction - 2 x 5 Median nerve glides in sitting - x 10  Median nerve glides at the wall for greater end range movement - x 10  Shoulder rows in standing with BTB  -- 2 x 15 Shoulder high rows in standing with BTB - 2 x 12 Shoulder presses in standing with 5# db - x5; x 3# 3 x 5  Performed exercises to improve strength and N/T symptoms     PT Education - 10/12/20 0912    Education Details form/technique with exercise    Person(s) Educated Patient    Methods Explanation;Demonstration    Comprehension Verbalized understanding;Returned demonstration            PT Short Term Goals - 09/06/20 1115      PT SHORT TERM GOAL #1   Title Pt will report decreased worse pain in bilat shoulder <7/10 bilat.    Baseline AT eval 9/10 Rt; 7/10 Lt    Time 4    Period Weeks    Status New    Target Date 10/04/20      PT SHORT TERM GOAL #2   Title Pt to demontrate improved functional activity tolerance AEB FOTO> 60.    Time 4    Period Weeks    Status New  Target Date 10/04/20             PT Long Term Goals - 09/06/20 1117      PT LONG TERM GOAL #1   Title Pt to demonstrate improved functional activity tolerance AED FOTO>65.    Time 8    Period Weeks    Status New    Target Date 11/01/20      PT LONG TERM GOAL #2   Title Pt to demonstrate improved aggregate shoulder P/ROM >375 degrees Rt; 425 degrees left to improve tolerance to dressing and housework.    Baseline (Flexion+Abduction+ER+IR) At eval: Rt 308 degrees; Lt 411 degrees.    Time 8    Period Weeks    Status New    Target Date 11/01/20      PT LONG TERM GOAL #3   Title Pt to demonstrate improved aggregate shoulder P/ROM >400 degrees Rt; 440 degrees left to improve tolerance to dressing and housework.    Baseline (Flexion+Abduction+ER+IR) At eval: Rt 308 degrees; Lt 411 degrees.    Time 12    Period Weeks    Status New    Target Date 11/29/20      PT LONG TERM GOAL #4   Title Pt to report/demonstrate  ability to lift and carry 25lb item over 163ft 10x with breaks as needed to demonstrate ability to grocery shop, perform farm work tasks.    Baseline unable to lift/carry at eval    Time 12    Period Weeks    Status New    Target Date 11/29/20                 Plan - 10/12/20 8185    Clinical Impression Statement Performed greater amount of neurodynamics performed today secondary to main compliant currently being the N/T in the R UE into the palmar and dorsal aspect of the hand. Patient is making improvements overall, however conitnues to have N/T and increased weakness along the shoulder flexor musculature. Patient will benefit from further skilled therapy to return to prior level of function.    Personal Factors and Comorbidities Age;Behavior Pattern;Comorbidity 1;Education;Time since onset of injury/illness/exacerbation;Social Background    Comorbidities Smoker    Examination-Activity Limitations Bathing;Reach Overhead;Carry;Dressing;Hygiene/Grooming    Examination-Participation Restrictions Cleaning;Driving;Yard Work;Occupation    Stability/Clinical Decision Making Stable/Uncomplicated    Rehab Potential Fair    PT Frequency 2x / week    PT Duration 12 weeks    PT Treatment/Interventions Electrical Stimulation;Moist Heat;Therapeutic activities;Therapeutic exercise;Neuromuscular re-education;Manual techniques;Patient/family education;Passive range of motion;Dry needling;Joint Manipulations    PT Next Visit Plan Review HEP, add doorframe 4-way shoulder isometrics to HEP    PT Home Exercise Plan At Eval: flexion table slides bilat; scapular elevation, scapular retraction (all TID)    Consulted and Agree with Plan of Care Patient           Patient will benefit from skilled therapeutic intervention in order to improve the following deficits and impairments:  Decreased range of motion,Decreased endurance,Decreased activity tolerance,Decreased knowledge of precautions,Decreased  balance,Decreased knowledge of use of DME,Decreased cognition,Decreased mobility,Decreased strength,Postural dysfunction,Impaired flexibility,Hypomobility,Impaired perceived functional ability,Impaired UE functional use  Visit Diagnosis: Chronic right shoulder pain  Chronic left shoulder pain     Problem List There are no problems to display for this patient.   Melvin Carney 10/12/2020, 9:38 AM  [facility] [address] [place], [address] Phone: [phone]   Fax:  [phone]  Name: Melvin Carney  MRN: [id number] Date of Birth: 1950/11/08

## 2020-10-17 ENCOUNTER — Ambulatory Visit: Payer: 59

## 2020-10-20 ENCOUNTER — Ambulatory Visit: Payer: 59

## 2020-10-26 ENCOUNTER — Ambulatory Visit: Payer: 59

## 2020-11-07 ENCOUNTER — Ambulatory Visit: Payer: 59 | Attending: Family Medicine

## 2020-11-07 ENCOUNTER — Other Ambulatory Visit: Payer: Self-pay

## 2020-11-07 DIAGNOSIS — M25512 Pain in left shoulder: Secondary | ICD-10-CM | POA: Diagnosis present

## 2020-11-07 DIAGNOSIS — G8929 Other chronic pain: Secondary | ICD-10-CM

## 2020-11-07 DIAGNOSIS — M25511 Pain in right shoulder: Secondary | ICD-10-CM | POA: Diagnosis not present

## 2020-11-07 NOTE — Therapy (Signed)
[facility] [address], [place], [address] Phone: [phone]   Fax:  [phone]  Physical Therapy Treatment  Patient Details  Name: Melvin Carney MRN: [id number] Date of Birth: Mar 04, 1951 Referring Provider (PT): Curtis Sites MD    Encounter Date: 11/07/2020   PT End of Session - 11/07/20 1444    Visit Number 7    Number of Visits 16    Date for PT Re-Evaluation 11/29/20    Authorization Type UHC MCR    Authorization Time Period 09/06/20-11/29/20    PT Start Time 6144    PT Stop Time 1430    PT Time Calculation (min) 45 min    Activity Tolerance Patient limited by pain;Patient tolerated treatment well;Other (comment)    Behavior During Therapy WFL for tasks assessed/performed           Past Medical History:  Diagnosis Date  . Hyperlipidemia     Past Surgical History:  Procedure Laterality Date  . COLONOSCOPY WITH PROPOFOL N/A 03/17/2018   Procedure: COLONOSCOPY WITH PROPOFOL;  Surgeon: Manya Silvas, MD;  Location: [facility] ENDOSCOPY;  Service: Endoscopy;  Laterality: N/A;  . Right Knee Ligament Repair      There were no vitals filed for this visit.   Subjective Assessment - 11/07/20 1356    Subjective Patient states pain in shoulder is feeling better and feels like exercises have been helping.    Pertinent History Smoker    Limitations Other (comment)   sleeping   How long can you sit comfortably? No related    How long can you stand comfortably? No related    How long can you walk comfortably? No related    Diagnostic tests None    Patient Stated Goals resolve pain, return to lifting things for IADL/work    Currently in Pain? No/denies    Pain Onset More than a month ago            Therapeutic Exercise Radial nerve glides in sitting - x 15 [place] - House - Skyscraper - 2 x 10 Shoulder flexion performed B with RTB around wrist into horizontal abduction - 2 x 5 Shoulder  rows in standing with BTB  -- 2 x 15 Shoulder high rows in standing with Black TB - 2 x 15 Shoulder presses in standing with 6# db - 2x5;  Shoulder IR stretch w/towel - 2x20s    Performed exercises to improve strength and ROM   PT Education - 11/07/20 1439    Education Details form/technique with exercise    Person(s) Educated Patient    Methods Explanation;Demonstration    Comprehension Verbalized understanding;Returned demonstration            PT Short Term Goals - 09/06/20 1115      PT SHORT TERM GOAL #1   Title Pt will report decreased worse pain in bilat shoulder <7/10 bilat.    Baseline AT eval 9/10 Rt; 7/10 Lt    Time 4    Period Weeks    Status New    Target Date 10/04/20      PT SHORT TERM GOAL #2   Title Pt to demontrate improved functional activity tolerance AEB FOTO> 60.    Time 4    Period Weeks    Status New    Target Date 10/04/20             PT Long Term Goals - 09/06/20 1117  PT LONG TERM GOAL #1   Title Pt to demonstrate improved functional activity tolerance AED FOTO>65.    Time 8    Period Weeks    Status New    Target Date 11/01/20      PT LONG TERM GOAL #2   Title Pt to demonstrate improved aggregate shoulder P/ROM >375 degrees Rt; 425 degrees left to improve tolerance to dressing and housework.    Baseline (Flexion+Abduction+ER+IR) At eval: Rt 308 degrees; Lt 411 degrees.    Time 8    Period Weeks    Status New    Target Date 11/01/20      PT LONG TERM GOAL #3   Title Pt to demonstrate improved aggregate shoulder P/ROM >400 degrees Rt; 440 degrees left to improve tolerance to dressing and housework.    Baseline (Flexion+Abduction+ER+IR) At eval: Rt 308 degrees; Lt 411 degrees.    Time 12    Period Weeks    Status New    Target Date 11/29/20      PT LONG TERM GOAL #4   Title Pt to report/demonstrate ability to lift and carry 25lb item over 163ft 10x with breaks as needed to demonstrate ability to grocery shop, perform farm  work tasks.    Baseline unable to lift/carry at eval    Time 12    Period Weeks    Status New    Target Date 11/29/20                 Plan - 11/07/20 1440    Clinical Impression Statement First visit since 10/12/20. Patient showed equal ROM with shoulder flexion/abduciton bilaterally. Less N/T noted since last visit. Patient showd decreased shoulder IR side to side and decreased apley scratch test. Patient will benefit from skilled therapy to return to prior level of function.    Personal Factors and Comorbidities Age;Behavior Pattern;Comorbidity 1;Education;Time since onset of injury/illness/exacerbation;Social Background    Comorbidities Smoker    Examination-Activity Limitations Bathing;Reach Overhead;Carry;Dressing;Hygiene/Grooming    Examination-Participation Restrictions Cleaning;Driving;Yard Work;Occupation    Stability/Clinical Decision Making Stable/Uncomplicated    Rehab Potential Fair    PT Frequency 2x / week    PT Duration 12 weeks    PT Treatment/Interventions Electrical Stimulation;Moist Heat;Therapeutic activities;Therapeutic exercise;Neuromuscular re-education;Manual techniques;Patient/family education;Passive range of motion;Dry needling;Joint Manipulations    PT Next Visit Plan Review HEP, add doorframe 4-way shoulder isometrics to HEP    PT Home Exercise Plan At Eval: flexion table slides bilat; scapular elevation, scapular retraction (all TID)    Consulted and Agree with Plan of Care Patient           Patient will benefit from skilled therapeutic intervention in order to improve the following deficits and impairments:  Decreased range of motion,Decreased endurance,Decreased activity tolerance,Decreased knowledge of precautions,Decreased balance,Decreased knowledge of use of DME,Decreased cognition,Decreased mobility,Decreased strength,Postural dysfunction,Impaired flexibility,Hypomobility,Impaired perceived functional ability,Impaired UE functional use  Visit  Diagnosis: Chronic right shoulder pain  Chronic left shoulder pain     Problem List There are no problems to display for this patient.   Candi Leash SPT 11/07/2020, 2:46 PM  [facility] [address] [place], [address] Phone: [phone]   Fax:  [phone]  Name: Melvin Carney MRN: [id number] Date of Birth: 09-06-1951

## 2020-11-10 ENCOUNTER — Ambulatory Visit: Payer: 59

## 2020-11-10 ENCOUNTER — Other Ambulatory Visit: Payer: Self-pay

## 2020-11-10 DIAGNOSIS — G8929 Other chronic pain: Secondary | ICD-10-CM

## 2020-11-10 DIAGNOSIS — M25512 Pain in left shoulder: Secondary | ICD-10-CM

## 2020-11-10 DIAGNOSIS — M25511 Pain in right shoulder: Secondary | ICD-10-CM

## 2020-11-10 NOTE — Therapy (Addendum)
[facility] [address] [place], [address] Phone: [phone]   Fax:  [phone]  Physical Therapy Treatment  Patient Details  Name: Melvin Carney MRN: [id number] Date of Birth: Nov 26, 1950 Referring Provider (PT): Curtis Sites MD    Encounter Date: 11/10/2020   PT End of Session - 11/10/20 0957    Visit Number 8    Number of Visits 16    Date for PT Re-Evaluation 11/29/20    Authorization Type [employer] MCR    Authorization Time Period 09/06/20-11/29/20    PT Start Time 0900    PT Stop Time 0945    PT Time Calculation (min) 45 min    Activity Tolerance Patient limited by pain;Patient tolerated treatment well;Other (comment)    Behavior During Therapy WFL for tasks assessed/performed           Past Medical History:  Diagnosis Date  . Hyperlipidemia     Past Surgical History:  Procedure Laterality Date  . COLONOSCOPY WITH PROPOFOL N/A 03/17/2018   Procedure: COLONOSCOPY WITH PROPOFOL;  Surgeon: Manya Silvas, MD;  Location: [facility] ENDOSCOPY;  Service: Endoscopy;  Laterality: N/A;  . Right Knee Ligament Repair      There were no vitals filed for this visit.   Subjective Assessment - 11/10/20 0905    Subjective Patient had a little bit of soreness after last session, but it did not last more than a day. Feeling good today.    Pertinent History Smoker    Limitations Other (comment)   sleeping   How long can you sit comfortably? No related    How long can you stand comfortably? No related    How long can you walk comfortably? No related    Diagnostic tests None    Patient Stated Goals resolve pain, return to lifting things for IADL/work    Currently in Pain? No/denies    Pain Onset More than a month ago             Therapeutic Exercise Body blade 2x30s Shoulder flexion performed B with RTB around wrist into horizontal abduction - 2 x 5 Shoulder rows in standing with Black TB  -- 2 x  15  Shoulder presses in standing with 6# db - 3x5- longer rest after 2 Shoulder IR stretch w/towel - 2x20s  Lat Pull down- 2x12 #45 Ball against wall- 2x30s (3kg ball) Ball wall taps- 2x50 (2kg ball) Performed exercises to improve strength and ROM        PT Education - 11/10/20 0955    Education Details form/techniqie with exercise    Person(s) Educated Patient    Methods Explanation;Demonstration    Comprehension Verbalized understanding;Returned demonstration            PT Short Term Goals - 09/06/20 1115      PT SHORT TERM GOAL #1   Title Pt will report decreased worse pain in bilat shoulder <7/10 bilat.    Baseline AT eval 9/10 Rt; 7/10 Lt    Time 4    Period Weeks    Status New    Target Date 10/04/20      PT SHORT TERM GOAL #2   Title Pt to demontrate improved functional activity tolerance AEB FOTO> 60.    Time 4    Period Weeks    Status New    Target Date 10/04/20             PT Long Term Goals -  09/06/20 1117      PT LONG TERM GOAL #1   Title Pt to demonstrate improved functional activity tolerance AED FOTO>65.    Time 8    Period Weeks    Status New    Target Date 11/01/20      PT LONG TERM GOAL #2   Title Pt to demonstrate improved aggregate shoulder P/ROM >375 degrees Rt; 425 degrees left to improve tolerance to dressing and housework.    Baseline (Flexion+Abduction+ER+IR) At eval: Rt 308 degrees; Lt 411 degrees.    Time 8    Period Weeks    Status New    Target Date 11/01/20      PT LONG TERM GOAL #3   Title Pt to demonstrate improved aggregate shoulder P/ROM >400 degrees Rt; 440 degrees left to improve tolerance to dressing and housework.    Baseline (Flexion+Abduction+ER+IR) At eval: Rt 308 degrees; Lt 411 degrees.    Time 12    Period Weeks    Status New    Target Date 11/29/20      PT LONG TERM GOAL #4   Title Pt to report/demonstrate ability to lift and carry 25lb item over 149ft 10x with breaks as needed to demonstrate ability  to grocery shop, perform farm work tasks.    Baseline unable to lift/carry at eval    Time 12    Period Weeks    Status New    Target Date 11/29/20                 Plan - 11/10/20 0955    Clinical Impression Statement Continued focus on strength in todays session. Patient tolerated exercises with no increase in pain. Patient still has reduced ROM with IR when compared to other side. Patient will benefit from futher skilled therapy to return to prior level of function.    Personal Factors and Comorbidities Age;Behavior Pattern;Comorbidity 1;Education;Time since onset of injury/illness/exacerbation;Social Background    Comorbidities Smoker    Examination-Activity Limitations Bathing;Reach Overhead;Carry;Dressing;Hygiene/Grooming    Examination-Participation Restrictions Cleaning;Driving;Yard Work;Occupation    Stability/Clinical Decision Making Stable/Uncomplicated    Rehab Potential Fair    PT Frequency 2x / week    PT Duration 12 weeks    PT Treatment/Interventions Electrical Stimulation;Moist Heat;Therapeutic activities;Therapeutic exercise;Neuromuscular re-education;Manual techniques;Patient/family education;Passive range of motion;Dry needling;Joint Manipulations    PT Next Visit Plan Review HEP, add doorframe 4-way shoulder isometrics to HEP    PT Home Exercise Plan At Eval: flexion table slides bilat; scapular elevation, scapular retraction (all TID)    Consulted and Agree with Plan of Care Patient           Patient will benefit from skilled therapeutic intervention in order to improve the following deficits and impairments:  Decreased range of motion,Decreased endurance,Decreased activity tolerance,Decreased knowledge of precautions,Decreased balance,Decreased knowledge of use of DME,Decreased cognition,Decreased mobility,Decreased strength,Postural dysfunction,Impaired flexibility,Hypomobility,Impaired perceived functional ability,Impaired UE functional use  Visit  Diagnosis: Chronic right shoulder pain  Chronic left shoulder pain     Problem List There are no problems to display for this patient.   Candi Leash SPT 11/10/2020, 10:27 AM  [facility] [address] [place], [address] Phone: [phone]   Fax:  [phone]  Name: Raysean Graumann MRN: [id number] Date of Birth: 1950/11/30

## 2020-11-17 ENCOUNTER — Ambulatory Visit: Payer: 59

## 2021-07-28 ENCOUNTER — Other Ambulatory Visit (HOSPITAL_BASED_OUTPATIENT_CLINIC_OR_DEPARTMENT_OTHER): Payer: Self-pay | Admitting: Family Medicine

## 2021-07-28 ENCOUNTER — Other Ambulatory Visit (HOSPITAL_COMMUNITY): Payer: Self-pay | Admitting: Family Medicine

## 2021-07-28 DIAGNOSIS — Z720 Tobacco use: Secondary | ICD-10-CM

## 2021-12-25 IMAGING — CT CT CHEST LUNG CANCER SCREENING LOW DOSE W/O CM
1 series · 10 of 10 positions shown, 13 images · non-contrast
Comparison: None.

CLINICAL DATA: 68-year-old male current smoker, with 54 pack-year
history of smoking, for initial lung cancer screening

EXAM:
CT CHEST WITHOUT CONTRAST LOW-DOSE FOR LUNG CANCER SCREENING
TECHNIQUE: Multidetector CT imaging of the chest was performed following the
standard protocol without IV contrast.

[ct lung segmentation data · axial · 0.63mm/px · z∈[+1463,+1463]mm · 10 of 276 frames shown]
[frame 1/276  mediastinal]
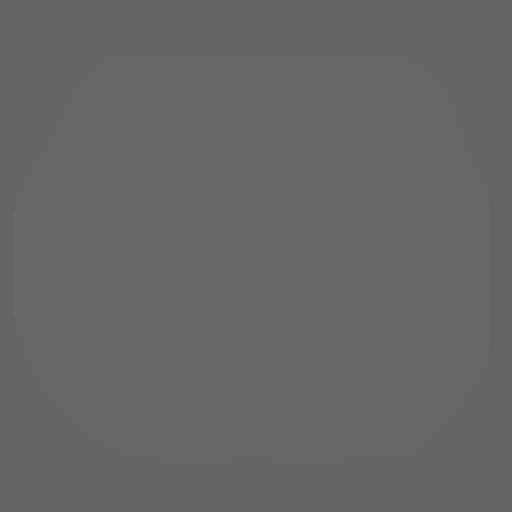
[frame 1/276  lung]
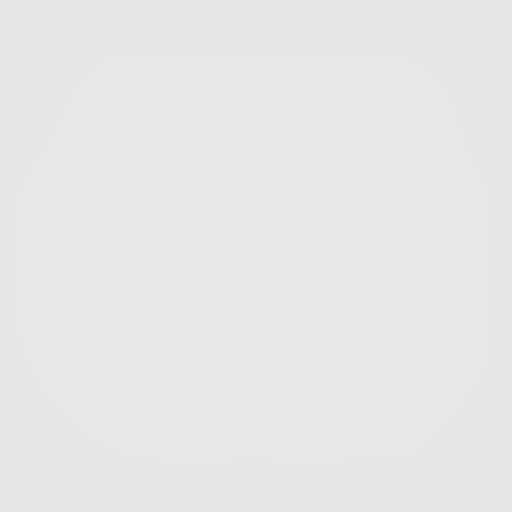
[frame 31/276  lung]
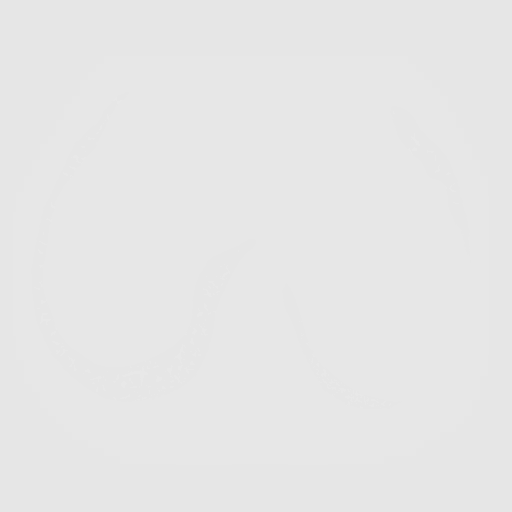
[frame 62/276  lung]
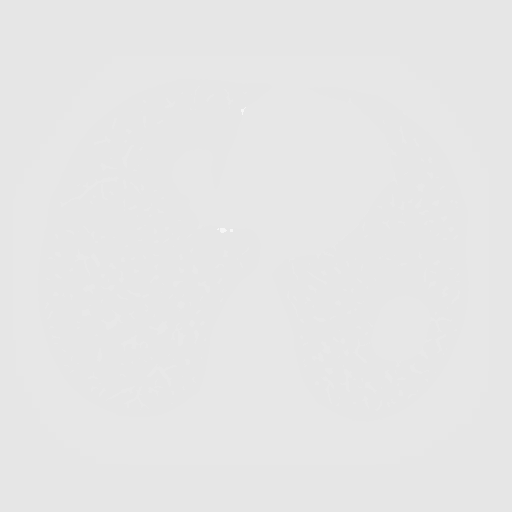
[frame 92/276  lung]
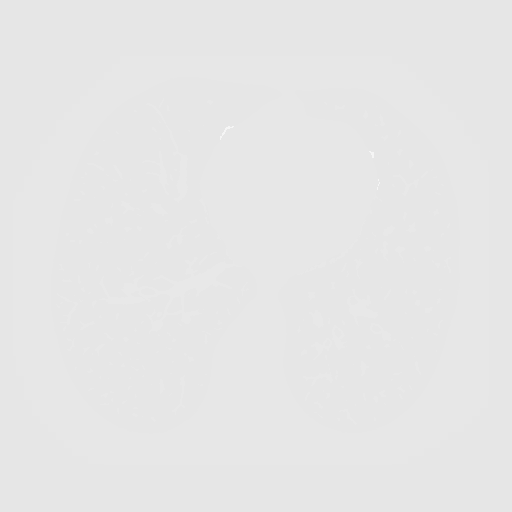
[frame 123/276  mediastinal]
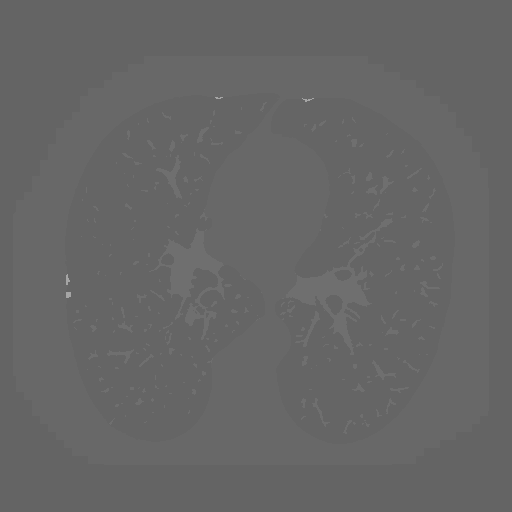
[frame 123/276  lung]
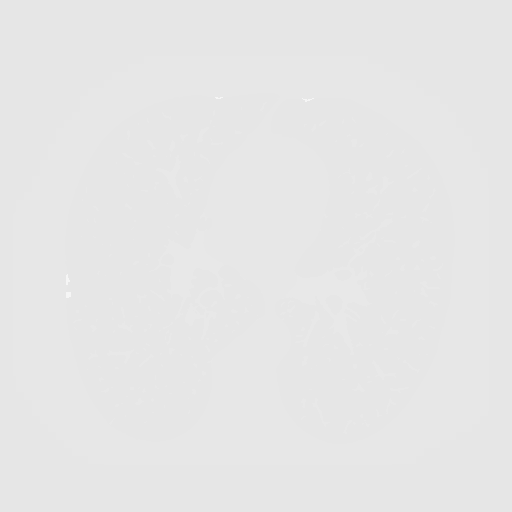
[frame 153/276  lung]
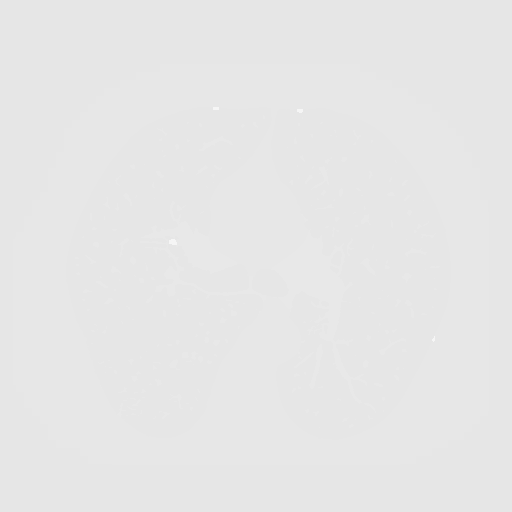
[frame 184/276  lung]
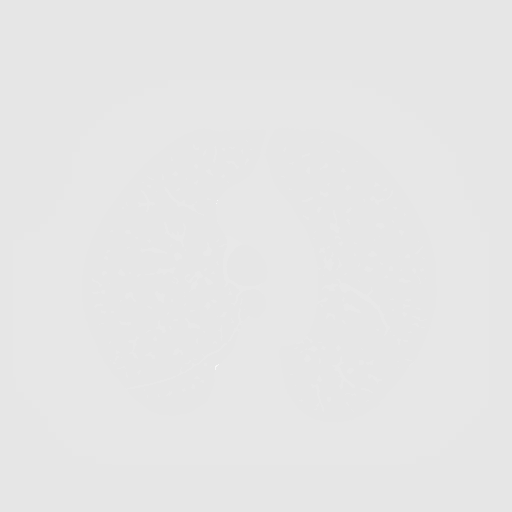
[frame 214/276  lung]
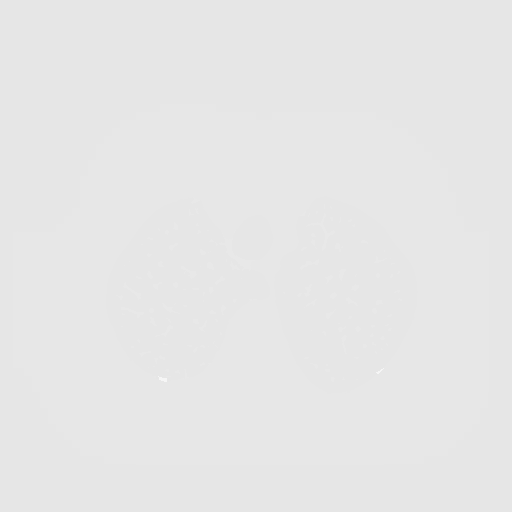
[frame 245/276  mediastinal]
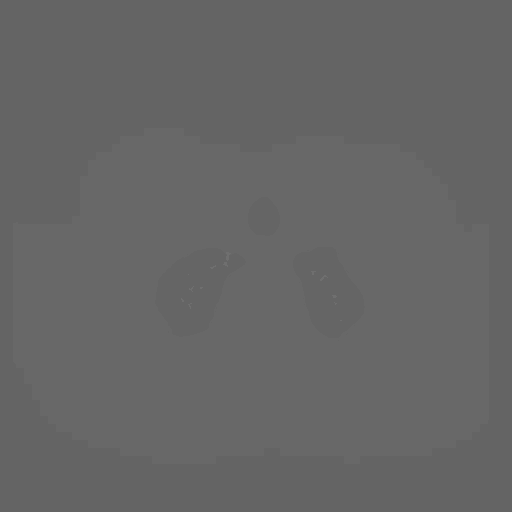
[frame 245/276  lung]
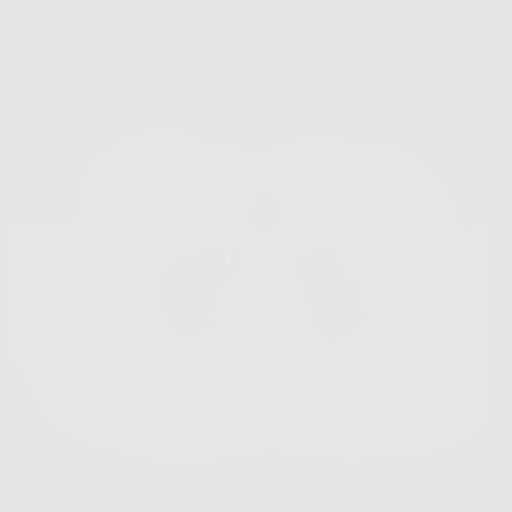
[frame 276/276  lung]
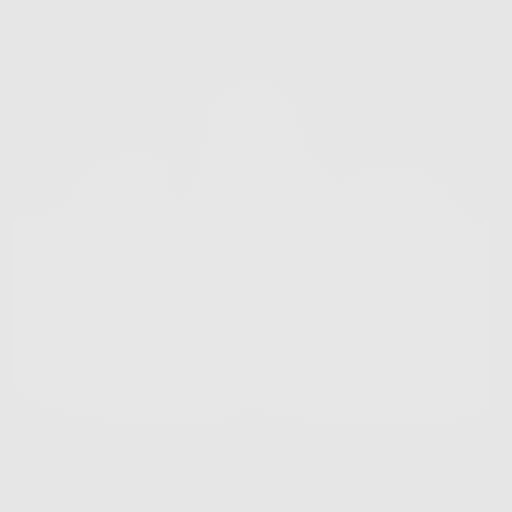

[10 of 10 positions shown; findings below may reference images not displayed]

FINDINGS: Cardiovascular: The heart is normal in size. No pericardial
effusion.

No evidence of thoracic aortic aneurysm. Mild atherosclerotic
calcifications of the aortic arch.

Coronary atherosclerosis of the LAD.

Mediastinum/Nodes: No suspicious mediastinal lymphadenopathy.

Visualized thyroid is unremarkable.

Lungs/Pleura: Moderate centrilobular and paraseptal emphysematous
changes, upper lung predominant.

3.8 mm subpleural calcified granuloma along the major fissure,
benign. No suspicious pulmonary nodules.

No focal consolidation.

No pleural effusion or pneumothorax.

Upper Abdomen: Visualized upper abdomen is grossly unremarkable.

Musculoskeletal: Degenerative changes of the visualized
thoracolumbar spine. Exaggerated thoracic kyphosis.
IMPRESSION: Lung-RADS 1, negative. Continue annual screening with low-dose chest
CT without contrast in 12 months.

Aortic Atherosclerosis (OGMKI-39W.W) and Emphysema (OGMKI-081.3).

## 2022-01-15 ENCOUNTER — Ambulatory Visit: Admission: RE | Admit: 2022-01-15 | Payer: 59 | Source: Home / Self Care

## 2022-01-15 ENCOUNTER — Encounter: Admission: RE | Payer: Self-pay | Source: Home / Self Care

## 2022-01-15 ENCOUNTER — Encounter: Payer: Self-pay | Admitting: Anesthesiology

## 2022-01-15 HISTORY — DX: Pure hypercholesterolemia, unspecified: E78.00

## 2022-01-15 SURGERY — COLONOSCOPY WITH PROPOFOL
Anesthesia: General

## 2022-06-01 ENCOUNTER — Other Ambulatory Visit (HOSPITAL_COMMUNITY): Payer: Self-pay | Admitting: Family Medicine

## 2022-06-01 ENCOUNTER — Other Ambulatory Visit: Payer: Self-pay | Admitting: Family Medicine

## 2022-06-01 DIAGNOSIS — F17200 Nicotine dependence, unspecified, uncomplicated: Secondary | ICD-10-CM

## 2022-06-01 DIAGNOSIS — Z720 Tobacco use: Secondary | ICD-10-CM

## 2022-06-01 DIAGNOSIS — Z136 Encounter for screening for cardiovascular disorders: Secondary | ICD-10-CM

## 2023-02-28 ENCOUNTER — Encounter (HOSPITAL_COMMUNITY): Payer: Self-pay | Admitting: Family Medicine

## 2023-02-28 DIAGNOSIS — Z720 Tobacco use: Secondary | ICD-10-CM

## 2023-03-14 ENCOUNTER — Ambulatory Visit (HOSPITAL_COMMUNITY)
Admission: RE | Admit: 2023-03-14 | Discharge: 2023-03-14 | Disposition: A | Discharge status: Home / Self Care | Payer: 59 | Source: Ambulatory Visit | Attending: Family Medicine | Admitting: Family Medicine

## 2023-03-14 DIAGNOSIS — Z720 Tobacco use: Secondary | ICD-10-CM | POA: Insufficient documentation

## 2023-03-14 DIAGNOSIS — F17200 Nicotine dependence, unspecified, uncomplicated: Secondary | ICD-10-CM | POA: Insufficient documentation

## 2023-03-14 DIAGNOSIS — I2510 Atherosclerotic heart disease of native coronary artery without angina pectoris: Secondary | ICD-10-CM | POA: Diagnosis not present

## 2023-03-14 DIAGNOSIS — R911 Solitary pulmonary nodule: Secondary | ICD-10-CM | POA: Insufficient documentation

## 2023-03-14 DIAGNOSIS — J439 Emphysema, unspecified: Secondary | ICD-10-CM | POA: Diagnosis not present

## 2023-03-14 DIAGNOSIS — K449 Diaphragmatic hernia without obstruction or gangrene: Secondary | ICD-10-CM | POA: Insufficient documentation

## 2023-03-14 DIAGNOSIS — F17210 Nicotine dependence, cigarettes, uncomplicated: Secondary | ICD-10-CM | POA: Insufficient documentation

## 2023-03-14 DIAGNOSIS — Z136 Encounter for screening for cardiovascular disorders: Secondary | ICD-10-CM | POA: Diagnosis present

## 2023-03-14 DIAGNOSIS — I700 Atherosclerosis of aorta: Secondary | ICD-10-CM | POA: Insufficient documentation

## 2023-03-14 DIAGNOSIS — Z122 Encounter for screening for malignant neoplasm of respiratory organs: Secondary | ICD-10-CM | POA: Diagnosis not present

## 2023-09-02 ENCOUNTER — Other Ambulatory Visit (HOSPITAL_COMMUNITY): Payer: Self-pay | Admitting: Family Medicine

## 2023-09-02 DIAGNOSIS — R911 Solitary pulmonary nodule: Secondary | ICD-10-CM

## 2024-01-11 ENCOUNTER — Encounter (HOSPITAL_COMMUNITY): Payer: Self-pay | Admitting: *Deleted

## 2024-01-11 ENCOUNTER — Other Ambulatory Visit: Payer: Self-pay

## 2024-01-11 ENCOUNTER — Emergency Department (HOSPITAL_COMMUNITY)
Admission: EM | Admit: 2024-01-11 | Discharge: 2024-01-11 | Discharge status: Against Medical Advice | Attending: Emergency Medicine | Admitting: Emergency Medicine

## 2024-01-11 DIAGNOSIS — Z5321 Procedure and treatment not carried out due to patient leaving prior to being seen by health care provider: Secondary | ICD-10-CM | POA: Insufficient documentation

## 2024-01-11 DIAGNOSIS — M549 Dorsalgia, unspecified: Secondary | ICD-10-CM | POA: Insufficient documentation

## 2024-01-11 DIAGNOSIS — R058 Other specified cough: Secondary | ICD-10-CM | POA: Diagnosis not present

## 2024-01-11 DIAGNOSIS — R0602 Shortness of breath: Secondary | ICD-10-CM | POA: Insufficient documentation

## 2024-01-11 DIAGNOSIS — M7989 Other specified soft tissue disorders: Secondary | ICD-10-CM | POA: Diagnosis not present

## 2024-01-11 HISTORY — DX: Pneumonia, unspecified organism: J18.9

## 2024-01-11 HISTORY — DX: Essential (primary) hypertension: I10

## 2024-01-11 NOTE — ED Notes (Signed)
 Pt's daughter insisting on taking pt back to [place].  I explained to daughter that the CN is working on getting room for pt.  Daughter still insisting on taking him to [place].

## 2024-01-11 NOTE — ED Triage Notes (Signed)
 Pt just discharged from [facility] on Tuesday, admitted for acute kidney failure.  Pt with SOB, swelling to bilateral legs with blisters.  C/o back pain for few days. Pt with productive cough-brown in color.  Pt with recent PNA as well.

## 2024-02-06 ENCOUNTER — Ambulatory Visit: Payer: Self-pay | Admitting: Internal Medicine

## 2024-02-17 ENCOUNTER — Encounter: Payer: Self-pay | Admitting: Internal Medicine

## 2024-02-19 ENCOUNTER — Other Ambulatory Visit: Payer: Self-pay

## 2024-02-19 DIAGNOSIS — N186 End stage renal disease: Secondary | ICD-10-CM

## 2024-03-03 ENCOUNTER — Encounter (INDEPENDENT_AMBULATORY_CARE_PROVIDER_SITE_OTHER): Payer: Self-pay | Admitting: *Deleted

## 2024-03-10 ENCOUNTER — Ambulatory Visit

## 2024-03-10 DIAGNOSIS — N186 End stage renal disease: Secondary | ICD-10-CM

## 2024-03-18 ENCOUNTER — Telehealth: Payer: Self-pay | Admitting: Gastroenterology

## 2024-03-18 NOTE — Telephone Encounter (Signed)
 Pt was mailed questionnaire and it was sent back to us . Called to verify address with pt was going to leave message and voicemail is full.

## 2024-03-23 NOTE — H&P (View-Only) (Signed)
 [facility]  ASSESSMENT / PLAN: Melvin Carney is a 73 y.o. right handed male in need of permanent dialysis access. I reviewed options for dialysis in detail with the patient, including hemodialysis and peritoneal dialysis. I counseled the patient to ask their nephrologist about their candidacy for renal transplant. I counseled the patient that dialysis access requires surveillance and periodic maintenance. Plan to proceed with left first stage brachiobasilic arteriovenous fistula creation.   CHIEF COMPLAINT: ESRD in need of access  HISTORY OF PRESENT ILLNESS: Melvin Carney is a 73 y.o. male with ESRD dialyzes with TDC. He is right handed. He has no prior dialysis access surgery. No history of pacemaker. We reviewed his duplex in detail.  Past Medical History:  Diagnosis Date   Hypercholesterolemia    Hyperlipidemia    Hypertension    PNA (pneumonia)     Past Surgical History:  Procedure Laterality Date   COLONOSCOPY WITH PROPOFOL  N/A 03/17/2018   Procedure: COLONOSCOPY WITH PROPOFOL ;  Surgeon: Viktoria Lamar DASEN, MD;  Location: [facility] ENDOSCOPY;  Service: Endoscopy;  Laterality: N/A;   Right Knee Ligament Repair      No family history on file.  Social History   Socioeconomic History   Marital status: Married    Spouse name: Not on file   Number of children: Not on file   Years of education: Not on file   Highest education level: Not on file  Occupational History   Not on file  Tobacco Use   Smoking status: Some Days    Current packs/day: 0.50    Average packs/day: 0.5 packs/day for 50.0 years (25.0 ttl pk-yrs)    Types: Cigarettes   Smokeless tobacco: Never  Vaping Use   Vaping status: Never Used  Substance and Sexual Activity   Alcohol use: Not Currently    Alcohol/week: 12.0 standard drinks of alcohol    Types: 12 Standard drinks or equivalent per week    Comment: 7.2 oz of  alcohol/week   Drug use: Not Currently    Types:  Marijuana, Cocaine   Sexual activity: Not on file  Other Topics Concern   Not on file  Social History Narrative   Not on file   Social Drivers of Health   Financial Resource Strain: Not on file  Food Insecurity: Food Insecurity Present (01/13/2024)   Received from [facility]   Hunger Vital Sign    Within the past 12 months, you worried that your food would run out before you got the money to buy more.: Sometimes true    Within the past 12 months, the food you bought just didn't last and you didn't have money to get more.: Sometimes true  Transportation Needs: No Transportation Needs (01/13/2024)   Received from [facility] - Transportation    Lack of Transportation (Medical): No    Lack of Transportation (Non-Medical): No  Physical Activity: Not on file  Stress: No Stress Concern Present (01/12/2024)   Received from [facility] - Occupational Stress Questionnaire    Feeling of Stress : Only a little  Social Connections: Not on file  Intimate Partner Violence: Not At Risk (01/12/2024)   Received from [facility]   HITS    Over the last 12 months how often did your partner physically hurt you?: Never    Over the last 12 months how often did your partner insult you or talk down to you?: Never  Over the last 12 months how often did your partner threaten you with physical harm?: Never    Over the last 12 months how often did your partner scream or curse at you?: Never    No Known Allergies  Current Outpatient Medications  Medication Sig Dispense Refill   albuterol (VENTOLIN HFA) 108 (90 Base) MCG/ACT inhaler Inhale into the lungs every 6 (six) hours as needed for wheezing or shortness of breath.     Cholecalciferol (VITAMIN D3) 50 MCG (2000 UT) TABS Take by mouth.     fluticasone (FLONASE) 50 MCG/ACT nasal spray Place into both nostrils daily.     naproxen (NAPROSYN) 500 MG tablet Take 500 mg by mouth 2 (two) times daily  with a meal.     pravastatin (PRAVACHOL) 40 MG tablet Take 40 mg by mouth daily.     tiotropium (SPIRIVA) 18 MCG inhalation capsule Place 18 mcg into inhaler and inhale daily.     No current facility-administered medications for this visit.    PHYSICAL EXAM Vitals:   03/24/24 1054  BP: (!) 167/107  Pulse: 90  SpO2: 96%    Elderly man in no distress Regular rate and rhythm Unlabored breathing 2+ radial pulses   PERTINENT LABORATORY AND RADIOLOGIC DATA  UPPER EXTREMITY VEIN MAPPING  Patient Name:  Melvin Carney  Date of Exam:   03/10/2024 Medical Rec #: [id number]        Accession #:    [id number] Date of Birth: April 29, 1951        Patient Gender: M Patient Age:   63 years Exam Location:  [address] Procedure:      VAS US  UPPER EXT VEIN MAPPING (PRE-OP  AVF) Referring Phys: DONNICE SENDER   --------------------------------------------------------------------------- -----   Indications: Pre-access.  Performing Technologist: [place] RVT    Examination Guidelines: A complete evaluation includes B-mode imaging, spectral Doppler, color Doppler, and power Doppler as needed of all accessible portions of each vessel. Bilateral testing is considered an integral part of a complete examination. Limited examinations for reoccurring indications may be performed as noted.  +-----------------+-------------+----------+--------+ Right Cephalic   Diameter (cm)Depth (cm)Findings +-----------------+-------------+----------+--------+ Prox upper arm       0.37                        +-----------------+-------------+----------+--------+ Mid upper arm        0.23                        +-----------------+-------------+----------+--------+ Dist upper arm       0.23                        +-----------------+-------------+----------+--------+ Antecubital fossa    0.25                        +-----------------+-------------+----------+--------+ Prox  forearm         0.24                        +-----------------+-------------+----------+--------+ Mid forearm          0.29                        +-----------------+-------------+----------+--------+ Dist forearm         0.13                        +-----------------+-------------+----------+--------+  +-----------------+-------------+----------+--------+  Right Basilic    Diameter (cm)Depth (cm)Findings +-----------------+-------------+----------+--------+ Prox upper arm       0.32                        +-----------------+-------------+----------+--------+ Mid upper arm        0.00                        +-----------------+-------------+----------+--------+ Dist upper arm       0.25                        +-----------------+-------------+----------+--------+ Antecubital fossa    0.15                        +-----------------+-------------+----------+--------+  +-----------------+-------------+----------+--------+ Left Cephalic    Diameter (cm)Depth (cm)Findings +-----------------+-------------+----------+--------+ Prox upper arm       0.20                        +-----------------+-------------+----------+--------+ Mid upper arm        0.19                        +-----------------+-------------+----------+--------+ Dist upper arm       0.25                        +-----------------+-------------+----------+--------+ Antecubital fossa    0.36                        +-----------------+-------------+----------+--------+ Prox forearm         0.32                        +-----------------+-------------+----------+--------+ Mid forearm          0.26                        +-----------------+-------------+----------+--------+ Dist forearm         0.20                        +-----------------+-------------+----------+--------+  +-----------------+-------------+----------+--------+ Left Basilic      Diameter (cm)Depth (cm)Findings +-----------------+-------------+----------+--------+ Mid upper arm        0.39                        +-----------------+-------------+----------+--------+ Dist upper arm       0.38                        +-----------------+-------------+----------+--------+ Antecubital fossa    0.35                        +-----------------+-------------+----------+--------+  Summary: Right: Patent cephalic and basilic veins. Left: Patent cephalic and basilic veins.  *See table(s) above for measurements and observations.     Diagnosing physician: Lonni Gaskins MD Electronically signed by Lonni Gaskins MD on 03/10/2024 at 3:30:28 PM.    Debby SAILOR. Magda, MD [facility] Phone Number: [phone] 03/23/2024 4:36 PM   Total time spent on preparing this encounter including chart review, data review, collecting history, examining the patient, and coordinating care: 45 minutes  Portions of this report may have been  transcribed using voice recognition software.  Every effort has been made to ensure accuracy; however, inadvertent computerized transcription errors may still be present.

## 2024-03-23 NOTE — Progress Notes (Unsigned)
 [facility] OF [place]  ASSESSMENT / PLAN: Melvin Carney is a 73 y.o. right handed male in need of permanent dialysis access. I reviewed options for dialysis in detail with the patient, including hemodialysis and peritoneal dialysis. I counseled the patient to ask their nephrologist about their candidacy for renal transplant. I counseled the patient that dialysis access requires surveillance and periodic maintenance. Plan to proceed with left first stage brachiobasilic arteriovenous fistula creation.   CHIEF COMPLAINT: ESRD in need of access  HISTORY OF PRESENT ILLNESS: Melvin Carney is a 73 y.o. male with ESRD dialyzes with TDC. He is right handed. He has no prior dialysis access surgery. No history of pacemaker. We reviewed his duplex in detail.  Past Medical History:  Diagnosis Date   Hypercholesterolemia    Hyperlipidemia    Hypertension    PNA (pneumonia)     Past Surgical History:  Procedure Laterality Date   COLONOSCOPY WITH PROPOFOL  N/A 03/17/2018   Procedure: COLONOSCOPY WITH PROPOFOL ;  Surgeon: Viktoria Lamar DASEN, MD;  Location: [facility] ENDOSCOPY;  Service: Endoscopy;  Laterality: N/A;   Right Knee Ligament Repair      No family history on file.  Social History   Socioeconomic History   Marital status: Married    Spouse name: Not on file   Number of children: Not on file   Years of education: Not on file   Highest education level: Not on file  Occupational History   Not on file  Tobacco Use   Smoking status: Some Days    Current packs/day: 0.50    Average packs/day: 0.5 packs/day for 50.0 years (25.0 ttl pk-yrs)    Types: Cigarettes   Smokeless tobacco: Never  Vaping Use   Vaping status: Never Used  Substance and Sexual Activity   Alcohol use: Not Currently    Alcohol/week: 12.0 standard drinks of alcohol    Types: 12 Standard drinks or equivalent per week    Comment: 7.2 oz of  alcohol/week   Drug use: Not Currently    Types:  Marijuana, Cocaine   Sexual activity: Not on file  Other Topics Concern   Not on file  Social History Narrative   Not on file   Social Drivers of Health   Financial Resource Strain: Not on file  Food Insecurity: Food Insecurity Present (01/13/2024)   Received from [facility]   Hunger Vital Sign    Within the past 12 months, you worried that your food would run out before you got the money to buy more.: Sometimes true    Within the past 12 months, the food you bought just didn't last and you didn't have money to get more.: Sometimes true  Transportation Needs: No Transportation Needs (01/13/2024)   Received from [facility] - Transportation    Lack of Transportation (Medical): No    Lack of Transportation (Non-Medical): No  Physical Activity: Not on file  Stress: No Stress Concern Present (01/12/2024)   Received from [facility] - Occupational Stress Questionnaire    Feeling of Stress : Only a little  Social Connections: Not on file  Intimate Partner Violence: Not At Risk (01/12/2024)   Received from [facility]   HITS    Over the last 12 months how often did your partner physically hurt you?: Never    Over the last 12 months how often did your partner insult you or talk down to you?: Never  Over the last 12 months how often did your partner threaten you with physical harm?: Never    Over the last 12 months how often did your partner scream or curse at you?: Never    No Known Allergies  Current Outpatient Medications  Medication Sig Dispense Refill   albuterol (VENTOLIN HFA) 108 (90 Base) MCG/ACT inhaler Inhale into the lungs every 6 (six) hours as needed for wheezing or shortness of breath.     Cholecalciferol (VITAMIN D3) 50 MCG (2000 UT) TABS Take by mouth.     fluticasone (FLONASE) 50 MCG/ACT nasal spray Place into both nostrils daily.     naproxen (NAPROSYN) 500 MG tablet Take 500 mg by mouth 2 (two) times daily  with a meal.     pravastatin (PRAVACHOL) 40 MG tablet Take 40 mg by mouth daily.     tiotropium (SPIRIVA) 18 MCG inhalation capsule Place 18 mcg into inhaler and inhale daily.     No current facility-administered medications for this visit.    PHYSICAL EXAM Vitals:   03/24/24 1054  BP: (!) 167/107  Pulse: 90  SpO2: 96%    Elderly man in no distress Regular rate and rhythm Unlabored breathing 2+ radial pulses   PERTINENT LABORATORY AND RADIOLOGIC DATA  UPPER EXTREMITY VEIN MAPPING  Patient Name:  DOMENICK QUEBEDEAUX  Date of Exam:   03/10/2024 Medical Rec #: [id number]        Accession #:    [id number] Date of Birth: 04/22/1951        Patient Gender: M Patient Age:   70 years Exam Location:  [address] Procedure:      VAS US  UPPER EXT VEIN MAPPING (PRE-OP  AVF) Referring Phys: DONNICE SENDER   --------------------------------------------------------------------------- -----   Indications: Pre-access.  Performing Technologist: [place] RVT    Examination Guidelines: A complete evaluation includes B-mode imaging, spectral Doppler, color Doppler, and power Doppler as needed of all accessible portions of each vessel. Bilateral testing is considered an integral part of a complete examination. Limited examinations for reoccurring indications may be performed as noted.  +-----------------+-------------+----------+--------+ Right Cephalic   Diameter (cm)Depth (cm)Findings +-----------------+-------------+----------+--------+ Prox upper arm       0.37                        +-----------------+-------------+----------+--------+ Mid upper arm        0.23                        +-----------------+-------------+----------+--------+ Dist upper arm       0.23                        +-----------------+-------------+----------+--------+ Antecubital fossa    0.25                        +-----------------+-------------+----------+--------+ Prox  forearm         0.24                        +-----------------+-------------+----------+--------+ Mid forearm          0.29                        +-----------------+-------------+----------+--------+ Dist forearm         0.13                        +-----------------+-------------+----------+--------+  +-----------------+-------------+----------+--------+  Right Basilic    Diameter (cm)Depth (cm)Findings +-----------------+-------------+----------+--------+ Prox upper arm       0.32                        +-----------------+-------------+----------+--------+ Mid upper arm        0.00                        +-----------------+-------------+----------+--------+ Dist upper arm       0.25                        +-----------------+-------------+----------+--------+ Antecubital fossa    0.15                        +-----------------+-------------+----------+--------+  +-----------------+-------------+----------+--------+ Left Cephalic    Diameter (cm)Depth (cm)Findings +-----------------+-------------+----------+--------+ Prox upper arm       0.20                        +-----------------+-------------+----------+--------+ Mid upper arm        0.19                        +-----------------+-------------+----------+--------+ Dist upper arm       0.25                        +-----------------+-------------+----------+--------+ Antecubital fossa    0.36                        +-----------------+-------------+----------+--------+ Prox forearm         0.32                        +-----------------+-------------+----------+--------+ Mid forearm          0.26                        +-----------------+-------------+----------+--------+ Dist forearm         0.20                        +-----------------+-------------+----------+--------+  +-----------------+-------------+----------+--------+ Left Basilic      Diameter (cm)Depth (cm)Findings +-----------------+-------------+----------+--------+ Mid upper arm        0.39                        +-----------------+-------------+----------+--------+ Dist upper arm       0.38                        +-----------------+-------------+----------+--------+ Antecubital fossa    0.35                        +-----------------+-------------+----------+--------+  Summary: Right: Patent cephalic and basilic veins. Left: Patent cephalic and basilic veins.  *See table(s) above for measurements and observations.     Diagnosing physician: Lonni Gaskins MD Electronically signed by Lonni Gaskins MD on 03/10/2024 at 3:30:28 PM.    Debby SAILOR. Magda, MD [facility] Phone Number: [phone] 03/23/2024 4:36 PM   Total time spent on preparing this encounter including chart review, data review, collecting history, examining the patient, and coordinating care: 45 minutes  Portions of this report may have been  transcribed using voice recognition software.  Every effort has been made to ensure accuracy; however, inadvertent computerized transcription errors may still be present.

## 2024-03-24 ENCOUNTER — Ambulatory Visit (INDEPENDENT_AMBULATORY_CARE_PROVIDER_SITE_OTHER): Admitting: Vascular Surgery

## 2024-03-24 ENCOUNTER — Telehealth: Payer: Self-pay

## 2024-03-24 ENCOUNTER — Encounter: Payer: Self-pay | Admitting: Vascular Surgery

## 2024-03-24 VITALS — BP 167/107 | HR 90

## 2024-03-24 DIAGNOSIS — Z992 Dependence on renal dialysis: Secondary | ICD-10-CM | POA: Diagnosis not present

## 2024-03-24 DIAGNOSIS — N186 End stage renal disease: Secondary | ICD-10-CM | POA: Diagnosis not present

## 2024-03-24 NOTE — Telephone Encounter (Signed)
 Attempted to call for surgery scheduling. LVM

## 2024-03-30 ENCOUNTER — Other Ambulatory Visit: Payer: Self-pay

## 2024-03-30 DIAGNOSIS — N186 End stage renal disease: Secondary | ICD-10-CM

## 2024-04-14 ENCOUNTER — Encounter (HOSPITAL_COMMUNITY): Payer: Self-pay | Admitting: Vascular Surgery

## 2024-04-14 ENCOUNTER — Other Ambulatory Visit: Payer: Self-pay

## 2024-04-14 NOTE — Progress Notes (Addendum)
 SDW CALL  Patient was given pre-op  instructions over the phone. The opportunity was given for the patient to ask questions. No further questions asked. Patient verbalized understanding of instructions given.  Date and Arrival time- April 15, 2024  PCP - MARIANO LINDAU  Cardiologist -   PPM/ICD - denies Device Orders -  Rep Notified -   Chest x-ray -  EKG - 01-11-24 Stress Test -  ECHO - 01-21-24 (CE) Cardiac Cath -   Sleep Study - denies CPAP - n/a  Dm -denies  Blood Thinner Instructions: denies Aspirin Instructions:n/a  ERAS Protcol -NPO   COVID TEST- n/a   Anesthesia review: no  Information above obtained by patient's daughter. Requested daughter have patient bring in medications or a list of medication. Patient has hx of shingles daughter reports no further problems, resolved  Patient denies shortness of breath, fever, cough and chest pain over the phone call   All instructions explained to the patient, with a verbal understanding of the material. Patient agrees to go over the instructions while at home for a better understanding.

## 2024-04-15 ENCOUNTER — Encounter (HOSPITAL_COMMUNITY)
Admission: RE | Disposition: A | Discharge status: Home / Self Care | Payer: Self-pay | Source: Home / Self Care | Attending: Vascular Surgery

## 2024-04-15 ENCOUNTER — Other Ambulatory Visit: Payer: Self-pay

## 2024-04-15 ENCOUNTER — Ambulatory Visit (HOSPITAL_COMMUNITY): Admitting: Anesthesiology

## 2024-04-15 ENCOUNTER — Other Ambulatory Visit (HOSPITAL_COMMUNITY): Payer: Self-pay

## 2024-04-15 ENCOUNTER — Ambulatory Visit (HOSPITAL_COMMUNITY)
Admission: RE | Admit: 2024-04-15 | Discharge: 2024-04-15 | Disposition: A | Discharge status: Home / Self Care | Attending: Vascular Surgery | Admitting: Vascular Surgery

## 2024-04-15 ENCOUNTER — Encounter (HOSPITAL_COMMUNITY): Payer: Self-pay | Admitting: Vascular Surgery

## 2024-04-15 DIAGNOSIS — E7800 Pure hypercholesterolemia, unspecified: Secondary | ICD-10-CM | POA: Diagnosis not present

## 2024-04-15 DIAGNOSIS — Z791 Long term (current) use of non-steroidal anti-inflammatories (NSAID): Secondary | ICD-10-CM | POA: Diagnosis not present

## 2024-04-15 DIAGNOSIS — F17210 Nicotine dependence, cigarettes, uncomplicated: Secondary | ICD-10-CM

## 2024-04-15 DIAGNOSIS — N186 End stage renal disease: Secondary | ICD-10-CM | POA: Diagnosis not present

## 2024-04-15 DIAGNOSIS — Z79899 Other long term (current) drug therapy: Secondary | ICD-10-CM | POA: Diagnosis not present

## 2024-04-15 DIAGNOSIS — I120 Hypertensive chronic kidney disease with stage 5 chronic kidney disease or end stage renal disease: Secondary | ICD-10-CM | POA: Diagnosis not present

## 2024-04-15 DIAGNOSIS — Z992 Dependence on renal dialysis: Secondary | ICD-10-CM | POA: Diagnosis not present

## 2024-04-15 HISTORY — DX: Chronic kidney disease, unspecified: N18.9

## 2024-04-15 HISTORY — DX: Headache, unspecified: R51.9

## 2024-04-15 HISTORY — DX: Chronic obstructive pulmonary disease, unspecified: J44.9

## 2024-04-15 HISTORY — DX: Unspecified osteoarthritis, unspecified site: M19.90

## 2024-04-15 HISTORY — PX: BASCILIC VEIN TRANSPOSITION: SHX5742

## 2024-04-15 HISTORY — DX: Dyspnea, unspecified: R06.00

## 2024-04-15 LAB — POCT I-STAT, CHEM 8
BUN: 11 mg/dL (ref 8–23)
Calcium, Ion: 1.15 mmol/L (ref 1.15–1.40)
Chloride: 100 mmol/L (ref 98–111)
Creatinine, Ser: 2.7 mg/dL — ABNORMAL HIGH (ref 0.61–1.24)
Glucose, Bld: 96 mg/dL (ref 70–99)
HCT: 33 % — ABNORMAL LOW (ref 39.0–52.0)
Hemoglobin: 11.2 g/dL — ABNORMAL LOW (ref 13.0–17.0)
Potassium: 3.2 mmol/L — ABNORMAL LOW (ref 3.5–5.1)
Sodium: 139 mmol/L (ref 135–145)
TCO2: 28 mmol/L (ref 22–32)

## 2024-04-15 SURGERY — TRANSPOSITION, VEIN, BASILIC
Anesthesia: Monitor Anesthesia Care | Laterality: Left

## 2024-04-15 MED ORDER — FENTANYL CITRATE (PF) 100 MCG/2ML IJ SOLN: 50.0000 ug | Freq: Once | INTRAMUSCULAR | Status: AC

## 2024-04-15 MED ORDER — CARVEDILOL 12.5 MG PO TABS: 25.0000 mg | ORAL_TABLET | Freq: Once | ORAL | Status: AC

## 2024-04-15 MED ORDER — CHLORHEXIDINE GLUCONATE 4 % EX SOLN: 60.0000 mL | Freq: Once | CUTANEOUS | Status: DC

## 2024-04-15 MED ORDER — OXYCODONE-ACETAMINOPHEN 5-325 MG PO TABS
1.0000 | ORAL_TABLET | Freq: Four times a day (QID) | ORAL | 0 refills | Status: DC | PRN
  Filled 2024-04-15: qty 8, 2d supply, fill #0

## 2024-04-15 MED ORDER — FENTANYL CITRATE (PF) 100 MCG/2ML IJ SOLN
INTRAMUSCULAR | Status: AC
  Administered 2024-04-15: 50 ug via INTRAVENOUS
  Filled 2024-04-15: qty 2

## 2024-04-15 MED ORDER — OXYCODONE HCL 5 MG/5ML PO SOLN: 5.0000 mg | Freq: Once | ORAL | Status: DC | PRN

## 2024-04-15 MED ORDER — ORAL CARE MOUTH RINSE: 15.0000 mL | Freq: Once | OROMUCOSAL | Status: AC

## 2024-04-15 MED ORDER — LIDOCAINE 2% (20 MG/ML) 5 ML SYRINGE
INTRAMUSCULAR | Status: DC | PRN
  Administered 2024-04-15: 20 mg via INTRAVENOUS

## 2024-04-15 MED ORDER — ONDANSETRON HCL 4 MG/2ML IJ SOLN
INTRAMUSCULAR | Status: DC | PRN
  Administered 2024-04-15: 4 mg via INTRAVENOUS

## 2024-04-15 MED ORDER — FENTANYL CITRATE (PF) 100 MCG/2ML IJ SOLN: 25.0000 ug | INTRAMUSCULAR | Status: DC | PRN

## 2024-04-15 MED ORDER — PROPOFOL 500 MG/50ML IV EMUL
INTRAVENOUS | Status: DC | PRN
Start: 2024-04-15 — End: 2024-04-15
  Administered 2024-04-15: 35 ug/kg/min via INTRAVENOUS

## 2024-04-15 MED ORDER — HEPARIN 6000 UNIT IRRIGATION SOLUTION
Status: AC
  Filled 2024-04-15: qty 500

## 2024-04-15 MED ORDER — CHLORHEXIDINE GLUCONATE 0.12 % MT SOLN
15.0000 mL | Freq: Once | OROMUCOSAL | Status: AC
  Administered 2024-04-15: 15 mL via OROMUCOSAL
  Filled 2024-04-15: qty 15

## 2024-04-15 MED ORDER — SODIUM CHLORIDE 0.9 % IV SOLN: INTRAVENOUS | Status: DC

## 2024-04-15 MED ORDER — 0.9 % SODIUM CHLORIDE (POUR BTL) OPTIME
TOPICAL | Status: DC | PRN
  Administered 2024-04-15: 1000 mL

## 2024-04-15 MED ORDER — HEPARIN 6000 UNIT IRRIGATION SOLUTION
Status: DC | PRN
  Administered 2024-04-15: 1

## 2024-04-15 MED ORDER — OXYCODONE HCL 5 MG PO TABS: 5.0000 mg | ORAL_TABLET | Freq: Once | ORAL | Status: DC | PRN

## 2024-04-15 MED ORDER — SODIUM CHLORIDE 0.9% FLUSH: 3.0000 mL | INTRAVENOUS | Status: DC | PRN

## 2024-04-15 MED ORDER — PHENYLEPHRINE HCL-NACL 20-0.9 MG/250ML-% IV SOLN
INTRAVENOUS | Status: DC | PRN
  Administered 2024-04-15: 30 ug/min via INTRAVENOUS

## 2024-04-15 MED ORDER — CEFAZOLIN SODIUM-DEXTROSE 2-4 GM/100ML-% IV SOLN
2.0000 g | INTRAVENOUS | Status: AC
  Administered 2024-04-15: 2 g via INTRAVENOUS
  Filled 2024-04-15: qty 100

## 2024-04-15 MED ORDER — MIDAZOLAM HCL 2 MG/2ML IJ SOLN
INTRAMUSCULAR | Status: AC
  Filled 2024-04-15: qty 2

## 2024-04-15 MED ORDER — ONDANSETRON HCL 4 MG/2ML IJ SOLN: 4.0000 mg | Freq: Once | INTRAMUSCULAR | Status: DC | PRN

## 2024-04-15 MED ORDER — MEPERIDINE HCL 25 MG/ML IJ SOLN: 6.2500 mg | INTRAMUSCULAR | Status: DC | PRN

## 2024-04-15 MED ORDER — CARVEDILOL 12.5 MG PO TABS
ORAL_TABLET | ORAL | Status: AC
  Administered 2024-04-15: 25 mg via ORAL
  Filled 2024-04-15: qty 2

## 2024-04-15 MED ORDER — ROPIVACAINE HCL 5 MG/ML IJ SOLN
INTRAMUSCULAR | Status: DC | PRN
  Administered 2024-04-15: 20 mL via PERINEURAL

## 2024-04-15 MED ORDER — ONDANSETRON HCL 4 MG/2ML IJ SOLN
INTRAMUSCULAR | Status: AC
  Filled 2024-04-15: qty 2

## 2024-04-15 SURGICAL SUPPLY — 27 items
ARMBAND PINK RESTRICT EXTREMIT (MISCELLANEOUS) ×1 IMPLANT
BAG COUNTER SPONGE SURGICOUNT (BAG) ×1 IMPLANT
BENZOIN TINCTURE PRP APPL 2/3 (GAUZE/BANDAGES/DRESSINGS) ×1 IMPLANT
CANISTER SUCTION 3000ML PPV (SUCTIONS) ×1 IMPLANT
CHLORAPREP W/TINT 26 (MISCELLANEOUS) ×1 IMPLANT
CLIP LIGATING EXTRA MED SLVR (CLIP) ×1 IMPLANT
CLIP LIGATING EXTRA SM BLUE (MISCELLANEOUS) ×1 IMPLANT
COVER PROBE W GEL 5X96 (DRAPES) ×1 IMPLANT
DERMABOND ADVANCED .7 DNX12 (GAUZE/BANDAGES/DRESSINGS) IMPLANT
ELECTRODE REM PT RTRN 9FT ADLT (ELECTROSURGICAL) ×1 IMPLANT
GLOVE BIO SURGEON STRL SZ8 (GLOVE) ×1 IMPLANT
GOWN STRL REUS W/ TWL LRG LVL3 (GOWN DISPOSABLE) ×2 IMPLANT
GOWN STRL REUS W/ TWL XL LVL3 (GOWN DISPOSABLE) ×1 IMPLANT
KIT BASIN OR (CUSTOM PROCEDURE TRAY) ×1 IMPLANT
KIT TURNOVER KIT B (KITS) ×1 IMPLANT
NS IRRIG 1000ML POUR BTL (IV SOLUTION) ×1 IMPLANT
PACK CV ACCESS (CUSTOM PROCEDURE TRAY) ×1 IMPLANT
PAD ARMBOARD POSITIONER FOAM (MISCELLANEOUS) ×2 IMPLANT
SLING ARM FOAM STRAP LRG (SOFTGOODS) IMPLANT
STRIP CLOSURE SKIN 1/2X4 (GAUZE/BANDAGES/DRESSINGS) ×2 IMPLANT
SUT MNCRL AB 4-0 PS2 18 (SUTURE) ×1 IMPLANT
SUT PROLENE 6 0 BV (SUTURE) ×1 IMPLANT
SUT SILK 2 0 SH (SUTURE) IMPLANT
SUT VIC AB 3-0 SH 27X BRD (SUTURE) ×1 IMPLANT
TOWEL GREEN STERILE (TOWEL DISPOSABLE) ×1 IMPLANT
UNDERPAD 30X36 HEAVY ABSORB (UNDERPADS AND DIAPERS) ×1 IMPLANT
WATER STERILE IRR 1000ML POUR (IV SOLUTION) ×1 IMPLANT

## 2024-04-15 NOTE — Anesthesia Preprocedure Evaluation (Addendum)
 Anesthesia Evaluation  Patient identified by MRN, date of birth, ID band Patient awake    Reviewed: Allergy & Precautions, H&P , NPO status , Patient's Chart, lab work & pertinent test results, reviewed documented beta blocker date and time   History of Anesthesia Complications Negative for: history of anesthetic complications  Airway Mallampati: I  TM Distance: >3 FB Neck ROM: full    Dental no notable dental hx. (+) Edentulous Upper, Edentulous Lower, Dental Advidsory Given   Pulmonary neg pulmonary ROS, neg shortness of breath, neg sleep apnea, COPD, neg recent URI, Current Smoker and Patient abstained from smoking.   Pulmonary exam normal breath sounds clear to auscultation       Cardiovascular Exercise Tolerance: Good hypertension, Pt. on medications negative cardio ROS Normal cardiovascular exam Rhythm:Regular Rate:Normal     Neuro/Psych negative neurological ROS  negative psych ROS   GI/Hepatic negative GI ROS, Neg liver ROS,,,  Endo/Other  negative endocrine ROS    Renal/GU DialysisRenal diseasenegative Renal ROS  negative genitourinary   Musculoskeletal negative musculoskeletal ROS (+)    Abdominal   Peds negative pediatric ROS (+)  Hematology negative hematology ROS (+)   Anesthesia Other Findings Past Medical History: No date: Hyperlipidemia   Reproductive/Obstetrics negative OB ROS                              Anesthesia Physical Anesthesia Plan  ASA: 4  Anesthesia Plan: MAC and Regional   Post-op Pain Management: Minimal or no pain anticipated and Regional block*   Induction: Intravenous  PONV Risk Score and Plan: 1 and Propofol  infusion  Airway Management Planned: Nasal Cannula and Natural Airway  Additional Equipment: None  Intra-op Plan:   Post-operative Plan: Extubation in OR  Informed Consent: I have reviewed the patients History and Physical, chart,  labs and discussed the procedure including the risks, benefits and alternatives for the proposed anesthesia with the patient or authorized representative who has indicated his/her understanding and acceptance.     Dental Advisory Given  Plan Discussed with: Anesthesiologist, CRNA and Surgeon  Anesthesia Plan Comments: (Discussed both nerve block for pain relief post-op and GA; including NV, sore throat, dental injury, and pulmonary complications)         Anesthesia Quick Evaluation

## 2024-04-15 NOTE — Discharge Instructions (Signed)
   [facility] of [facility]  Discharge Instructions  AV Fistula or Graft Surgery for Dialysis Access  Please refer to the following instructions for your post-procedure care. Your surgeon or physician assistant will discuss any changes with you.  Activity  You may drive the day following your surgery, if you are comfortable and no longer taking prescription pain medication. Resume full activity as the soreness in your incision resolves.  Bathing/Showering  You may shower after you go home. Keep your incision dry for 48 hours. Do not soak in a bathtub, hot tub, or swim until the incision heals completely. You may not shower if you have a hemodialysis catheter.  Incision Care  Clean your incision with mild soap and water after 48 hours. Pat the area dry with a clean towel. You do not need a bandage unless otherwise instructed. Do not apply any ointments or creams to your incision. You may have skin glue on your incision. Do not peel it off. It will come off on its own in about one week. Your arm may swell a bit after surgery. To reduce swelling use pillows to elevate your arm so it is above your heart. Your doctor will tell you if you need to lightly wrap your arm with an ACE bandage.  Diet  Resume your normal diet. There are not special food restrictions following this procedure. In order to heal from your surgery, it is CRITICAL to get adequate nutrition. Your body requires vitamins, minerals, and protein. Vegetables are the best source of vitamins and minerals. Vegetables also provide the perfect balance of protein. Processed food has little nutritional value, so try to avoid this.  Medications  Resume taking all of your medications. If your incision is causing pain, you may take over-the counter pain relievers such as acetaminophen  (Tylenol ). If you were prescribed a stronger pain medication, please be aware these medications can cause nausea and constipation. Prevent  nausea by taking the medication with a snack or meal. Avoid constipation by drinking plenty of fluids and eating foods with high amount of fiber, such as fruits, vegetables, and grains.  Do not take Tylenol  if you are taking prescription pain medications.  Follow up Your surgeon may want to see you in the office following your access surgery. If so, this will be arranged at the time of your surgery.  Please call us  immediately for any of the following conditions:  Increased pain, redness, drainage (pus) from your incision site Fever of 101 degrees or higher Severe or worsening pain at your incision site Hand pain or numbness.  Reduce your risk of vascular disease:  Stop smoking. If you would like help, call [facility] at 1-800-QUIT-NOW (502-106-3088) or [place] at 310-491-3841  Manage your cholesterol Maintain a desired weight Control your diabetes Keep your blood pressure down  Dialysis  It will take several weeks to several months for your new dialysis access to be ready for use. Your surgeon will determine when it is okay to use it. Your nephrologist will continue to direct your dialysis. You can continue to use your Permcath until your new access is ready for use.   04/15/2024 Melvin Carney [id number] Apr 10, 1951  Surgeon(s): Serene Gaile ORN, MD  Procedure(s): Creation left 1st stage basilic vein transposition  x Do not stick fistula for 12 weeks    If you have any questions, please call the office at [phone].

## 2024-04-15 NOTE — Anesthesia Procedure Notes (Signed)
 Procedure Name: MAC Date/Time: 04/15/2024 10:56 AM  Performed by: Boyce Shilling, CRNAPre-anesthesia Checklist: Patient identified, Emergency Drugs available, Suction available, Timeout performed and Patient being monitored Patient Re-evaluated:Patient Re-evaluated prior to induction Oxygen Delivery Method: Simple face mask Induction Type: IV induction Dental Injury: Teeth and Oropharynx as per pre-operative assessment

## 2024-04-15 NOTE — Op Note (Signed)
    Patient name: Melvin Carney MRN: [id number] DOB: 1951/09/22 Sex: male  04/15/2024 Pre-operative Diagnosis: ESRD Post-operative diagnosis:  Same Surgeon:  [name] Assistants:  S. Rhyne, PA Procedure:   Left brachiobasilic fistula (first stage) Anesthesia:  General Blood Loss:  minimal Specimens:  none  Findings: Basilic vein measured roughly 4 mm.  The cephalic vein was too small for fistula creation.  Brachial artery was 4 mm in disease-free  Indications: This is a 73 year old right-handed gentleman in need of dialysis access.  He is currently using a catheter.  Preoperative vein mapping indicated an adequate left basilic vein.  I discussed proceeding with a brachiobasilic fistula creation.  I also discussed the need for a second stage procedure prior to utilization of his fistula.  He understands this and wishes to proceed  Procedure:  The patient was identified in the holding area and taken to [facility] OR ROOM 16  The patient was then placed supine on the table. general anesthesia was administered.  The patient was prepped and draped in the usual sterile fashion.  A time out was called and antibiotics were administered.  PA was necessary to expedite the procedure and assist with technical details.  She helped with exposure by providing suction and retraction.  She helped with the anastomosis by following the suture.  Ultrasound was used to evaluate the surface veins in the left upper arm.  The basilic vein was adequate measuring 4-5 mm.  The cephalic vein was too small for fistula creation.  An oblique incision was made just proximal to the antecubital crease.  Cautery was used to divide subcutaneous tissue.  The brachial artery was circumferentially exposed and encircled with Vesseloops.  I then dissected out the basilic vein.  This was a 4 mm vein that was disease-free.  Several side branches were ligated between silk ties and the vein was ligated distally.  It distended nicely with  heparin  saline.  It was marked for orientation.  The brachial artery was then occluded with vascular clamps and a #11 blade was used to make an arteriotomy which was extended longitudinally with Potts scissors.  The vein was cut to the appropriate length and spatulated to fit the size the arteriotomy.  A running anastomosis was created with 6-0 Prolene.  Prior to completion, the appropriate flushing maneuvers were performed and the anastomosis was completed.  There was an excellent thrill within the vein and a palpable radial pulse.  I inspected the course of the vein to make sure there were no kinks or additional branches.  Hemostasis was then achieved.  The incision was closed with 2 layers of Vicryl followed by Dermabond.  The patient was successfully taken to the recovery room in stable condition.  There were no immediate complications.   Disposition: To PACU stable.   ALONSO [name], M.D., [facility] of [place] Office: [phone] Pager:  [phone]

## 2024-04-15 NOTE — Transfer of Care (Signed)
 Immediate Anesthesia Transfer of Care Note  Patient: Melvin Carney  Procedure(s) Performed: TRANSPOSITION, VEIN, BASILIC (Left)  Patient Location: PACU  Anesthesia Type:MAC combined with regional for post-op pain  Level of Consciousness: awake and alert   Airway & Oxygen Therapy: Patient Spontanous Breathing and Patient connected to face mask oxygen  Post-op Assessment: Report given to RN and Post -op Vital signs reviewed and stable  Post vital signs: Reviewed and stable  Last Vitals:  Vitals Value Taken Time  BP 129/73 04/15/24 12:25  Temp    Pulse 68 04/15/24 12:27  Resp 24 04/15/24 12:28  SpO2 97 % 04/15/24 12:27  Vitals shown include unfiled device data.  Last Pain:  Vitals:   04/15/24 1039  TempSrc:   PainSc: 0-No pain      Patients Stated Pain Goal: 0 (04/15/24 1012)  Complications: No notable events documented.

## 2024-04-15 NOTE — Anesthesia Procedure Notes (Signed)
 Anesthesia Regional Block: Supraclavicular block   Pre-Anesthetic Checklist: , timeout performed,  Correct Patient, Correct Site, Correct Laterality,  Correct Procedure, Correct Position, site marked,  Risks and benefits discussed,  Surgical consent,  Pre-op  evaluation,  At surgeon's request and post-op pain management  Laterality: Left  Prep: chloraprep       Needles:  Injection technique: Single-shot  Needle Type: Echogenic Stimulator Needle     Needle Length: 5cm  Needle Gauge: 22     Additional Needles:   Procedures:, nerve stimulator,,, ultrasound used (permanent image in chart),,     Nerve Stimulator or Paresthesia:  Response: hand, 0.45 mA  Additional Responses:   Narrative:  Start time: 04/15/2024 10:45 AM End time: 04/15/2024 10:50 AM Injection made incrementally with aspirations every 5 mL.  Performed by: Personally  Anesthesiologist: Mallory Manus, MD  Additional Notes: Functioning IV was confirmed and monitors were applied.  A 50mm 22ga Arrow echogenic stimulator needle was used. Sterile prep and drape,hand hygiene and sterile gloves were used. Ultrasound guidance: relevant anatomy identified, needle position confirmed, local anesthetic spread visualized around nerve(s)., vascular puncture avoided.  Image printed for medical record. Negative aspiration and negative test dose prior to incremental administration of local anesthetic. The patient tolerated the procedure well.

## 2024-04-15 NOTE — Interval H&P Note (Signed)
 History and Physical Interval Note:  04/15/2024 10:19 AM  Melvin Carney  has presented today for surgery, with the diagnosis of ESRD.  The various methods of treatment have been discussed with the patient and family. After consideration of risks, benefits and other options for treatment, the patient has consented to  Procedure(s): TRANSPOSITION, VEIN, BASILIC (Left) as a surgical intervention.  The patient's history has been reviewed, patient examined, no change in status, stable for surgery.  I have reviewed the patient's chart and labs.  Questions were answered to the patient's satisfaction.    We discussed the need for a second surgery if we use the basilic vein.  We discussed details of the case and risks / benefits.  All questions answered    [name]

## 2024-04-15 NOTE — Anesthesia Postprocedure Evaluation (Signed)
 Anesthesia Post Note  Patient: Melvin Carney  Procedure(s) Performed: TRANSPOSITION, VEIN, BASILIC (Left)     Patient location during evaluation: PACU Anesthesia Type: Regional and MAC Level of consciousness: awake and alert Pain management: pain level controlled Vital Signs Assessment: post-procedure vital signs reviewed and stable Respiratory status: spontaneous breathing, nonlabored ventilation, respiratory function stable and patient connected to nasal cannula oxygen Cardiovascular status: stable and blood pressure returned to baseline Postop Assessment: no apparent nausea or vomiting Anesthetic complications: no   No notable events documented.  Last Vitals:  Vitals:   04/15/24 1315 04/15/24 1330  BP: 115/70 118/70  Pulse: (!) 58 62  Resp: 18 19  Temp:    SpO2: 95% 90%    Last Pain:  Vitals:   04/15/24 1315  TempSrc:   PainSc: 0-No pain                 Kawhi Diebold

## 2024-04-16 ENCOUNTER — Encounter (HOSPITAL_COMMUNITY): Payer: Self-pay | Admitting: Surgery

## 2024-05-08 ENCOUNTER — Other Ambulatory Visit: Payer: Self-pay

## 2024-05-08 DIAGNOSIS — N186 End stage renal disease: Secondary | ICD-10-CM

## 2024-06-09 ENCOUNTER — Encounter

## 2024-06-19 ENCOUNTER — Other Ambulatory Visit: Payer: Self-pay

## 2024-06-19 DIAGNOSIS — N186 End stage renal disease: Secondary | ICD-10-CM

## 2024-06-23 ENCOUNTER — Encounter

## 2024-06-25 ENCOUNTER — Other Ambulatory Visit: Payer: Self-pay

## 2024-06-25 ENCOUNTER — Emergency Department (HOSPITAL_COMMUNITY)

## 2024-06-25 ENCOUNTER — Encounter (HOSPITAL_COMMUNITY): Payer: Self-pay

## 2024-06-25 ENCOUNTER — Inpatient Hospital Stay (HOSPITAL_COMMUNITY)
Admission: EM | Admit: 2024-06-25 | Discharge: 2024-06-28 | DRG: 291 | Disposition: A | Discharge status: Home / Self Care | Source: Ambulatory Visit | Attending: Family Medicine | Admitting: Family Medicine

## 2024-06-25 DIAGNOSIS — Z79899 Other long term (current) drug therapy: Secondary | ICD-10-CM | POA: Diagnosis not present

## 2024-06-25 DIAGNOSIS — Z992 Dependence on renal dialysis: Secondary | ICD-10-CM

## 2024-06-25 DIAGNOSIS — I132 Hypertensive heart and chronic kidney disease with heart failure and with stage 5 chronic kidney disease, or end stage renal disease: Secondary | ICD-10-CM | POA: Diagnosis present

## 2024-06-25 DIAGNOSIS — I5043 Acute on chronic combined systolic (congestive) and diastolic (congestive) heart failure: Secondary | ICD-10-CM | POA: Diagnosis present

## 2024-06-25 DIAGNOSIS — J9611 Chronic respiratory failure with hypoxia: Secondary | ICD-10-CM | POA: Diagnosis present

## 2024-06-25 DIAGNOSIS — J449 Chronic obstructive pulmonary disease, unspecified: Secondary | ICD-10-CM | POA: Diagnosis present

## 2024-06-25 DIAGNOSIS — E876 Hypokalemia: Secondary | ICD-10-CM | POA: Diagnosis present

## 2024-06-25 DIAGNOSIS — F17210 Nicotine dependence, cigarettes, uncomplicated: Secondary | ICD-10-CM | POA: Diagnosis present

## 2024-06-25 DIAGNOSIS — E7800 Pure hypercholesterolemia, unspecified: Secondary | ICD-10-CM | POA: Diagnosis present

## 2024-06-25 DIAGNOSIS — N186 End stage renal disease: Principal | ICD-10-CM | POA: Diagnosis present

## 2024-06-25 DIAGNOSIS — R079 Chest pain, unspecified: Secondary | ICD-10-CM | POA: Diagnosis present

## 2024-06-25 DIAGNOSIS — E038 Other specified hypothyroidism: Secondary | ICD-10-CM | POA: Diagnosis present

## 2024-06-25 DIAGNOSIS — M1990 Unspecified osteoarthritis, unspecified site: Secondary | ICD-10-CM | POA: Diagnosis present

## 2024-06-25 DIAGNOSIS — I2510 Atherosclerotic heart disease of native coronary artery without angina pectoris: Secondary | ICD-10-CM | POA: Diagnosis present

## 2024-06-25 DIAGNOSIS — M898X9 Other specified disorders of bone, unspecified site: Secondary | ICD-10-CM | POA: Diagnosis present

## 2024-06-25 DIAGNOSIS — Z9981 Dependence on supplemental oxygen: Secondary | ICD-10-CM | POA: Diagnosis not present

## 2024-06-25 DIAGNOSIS — I10 Essential (primary) hypertension: Secondary | ICD-10-CM | POA: Diagnosis not present

## 2024-06-25 DIAGNOSIS — D631 Anemia in chronic kidney disease: Secondary | ICD-10-CM | POA: Diagnosis present

## 2024-06-25 DIAGNOSIS — J441 Chronic obstructive pulmonary disease with (acute) exacerbation: Secondary | ICD-10-CM | POA: Diagnosis not present

## 2024-06-25 LAB — CBC WITH DIFFERENTIAL/PLATELET
Abs Immature Granulocytes: 0 K/uL (ref 0.00–0.07)
Basophils Absolute: 0 K/uL (ref 0.0–0.1)
Basophils Relative: 0 %
Eosinophils Absolute: 0.1 K/uL (ref 0.0–0.5)
Eosinophils Relative: 3 %
HCT: 39 % (ref 39.0–52.0)
Hemoglobin: 12.7 g/dL — ABNORMAL LOW (ref 13.0–17.0)
Immature Granulocytes: 0 %
Lymphocytes Relative: 32 %
Lymphs Abs: 1 K/uL (ref 0.7–4.0)
MCH: 29.7 pg (ref 26.0–34.0)
MCHC: 32.6 g/dL (ref 30.0–36.0)
MCV: 91.1 fL (ref 80.0–100.0)
Monocytes Absolute: 0.5 K/uL (ref 0.1–1.0)
Monocytes Relative: 15 %
Neutro Abs: 1.7 K/uL (ref 1.7–7.7)
Neutrophils Relative %: 50 %
Platelets: 129 K/uL — ABNORMAL LOW (ref 150–400)
RBC: 4.28 MIL/uL (ref 4.22–5.81)
RDW: 14.8 % (ref 11.5–15.5)
WBC: 3.3 K/uL — ABNORMAL LOW (ref 4.0–10.5)
nRBC: 0 % (ref 0.0–0.2)

## 2024-06-25 LAB — COMPREHENSIVE METABOLIC PANEL WITH GFR
ALT: 12 U/L (ref 0–44)
AST: 19 U/L (ref 15–41)
Albumin: 2.6 g/dL — ABNORMAL LOW (ref 3.5–5.0)
Alkaline Phosphatase: 87 U/L (ref 38–126)
Anion gap: 11 (ref 5–15)
BUN: 7 mg/dL — ABNORMAL LOW (ref 8–23)
CO2: 29 mmol/L (ref 22–32)
Calcium: 7.9 mg/dL — ABNORMAL LOW (ref 8.9–10.3)
Chloride: 97 mmol/L — ABNORMAL LOW (ref 98–111)
Creatinine, Ser: 1.57 mg/dL — ABNORMAL HIGH (ref 0.61–1.24)
GFR, Estimated: 47 mL/min — ABNORMAL LOW (ref >60)
Glucose, Bld: 99 mg/dL (ref 70–99)
Potassium: 3.3 mmol/L — ABNORMAL LOW (ref 3.5–5.1)
Sodium: 137 mmol/L (ref 135–145)
Total Bilirubin: 0.6 mg/dL (ref 0.0–1.2)
Total Protein: 5.9 g/dL — ABNORMAL LOW (ref 6.5–8.1)

## 2024-06-25 LAB — TROPONIN I (HIGH SENSITIVITY)
Troponin I (High Sensitivity): 37 ng/L — ABNORMAL HIGH (ref <18)
Troponin I (High Sensitivity): 38 ng/L — ABNORMAL HIGH (ref <18)

## 2024-06-25 LAB — BRAIN NATRIURETIC PEPTIDE: B Natriuretic Peptide: 3351 pg/mL — ABNORMAL HIGH (ref 0.0–100.0)

## 2024-06-25 MED ORDER — ACETAMINOPHEN 650 MG RE SUPP: 650.0000 mg | Freq: Four times a day (QID) | RECTAL | Status: DC | PRN

## 2024-06-25 MED ORDER — HYDRALAZINE HCL 20 MG/ML IJ SOLN: 5.0000 mg | INTRAMUSCULAR | Status: DC | PRN

## 2024-06-25 MED ORDER — POTASSIUM CHLORIDE 20 MEQ PO PACK
20.0000 meq | PACK | Freq: Once | ORAL | Status: AC
  Administered 2024-06-25: 20 meq via ORAL
  Filled 2024-06-25: qty 1

## 2024-06-25 MED ORDER — IPRATROPIUM-ALBUTEROL 0.5-2.5 (3) MG/3ML IN SOLN
3.0000 mL | Freq: Once | RESPIRATORY_TRACT | Status: AC
  Administered 2024-06-25: 3 mL via RESPIRATORY_TRACT
  Filled 2024-06-25: qty 3

## 2024-06-25 MED ORDER — FUROSEMIDE 10 MG/ML IJ SOLN
80.0000 mg | Freq: Once | INTRAMUSCULAR | Status: AC
Start: 2024-06-25 — End: 2024-06-25
  Administered 2024-06-25: 80 mg via INTRAVENOUS
  Filled 2024-06-25: qty 8

## 2024-06-25 MED ORDER — ALBUTEROL SULFATE (2.5 MG/3ML) 0.083% IN NEBU
2.5000 mg | INHALATION_SOLUTION | RESPIRATORY_TRACT | Status: DC | PRN
  Administered 2024-06-27: 2.5 mg via RESPIRATORY_TRACT
  Filled 2024-06-25: qty 3

## 2024-06-25 MED ORDER — IPRATROPIUM-ALBUTEROL 0.5-2.5 (3) MG/3ML IN SOLN
3.0000 mL | Freq: Four times a day (QID) | RESPIRATORY_TRACT | Status: DC
  Administered 2024-06-25 – 2024-06-26 (×2): 3 mL via RESPIRATORY_TRACT
  Filled 2024-06-25 (×2): qty 3

## 2024-06-25 MED ORDER — HEPARIN SODIUM (PORCINE) 5000 UNIT/ML IJ SOLN
5000.0000 [IU] | Freq: Three times a day (TID) | INTRAMUSCULAR | Status: DC
  Administered 2024-06-26 – 2024-06-28 (×7): 5000 [IU] via SUBCUTANEOUS
  Filled 2024-06-25 (×7): qty 1

## 2024-06-25 MED ORDER — ACETAMINOPHEN 325 MG PO TABS
650.0000 mg | ORAL_TABLET | Freq: Four times a day (QID) | ORAL | Status: DC | PRN
  Administered 2024-06-26: 650 mg via ORAL
  Filled 2024-06-25: qty 2

## 2024-06-25 MED ORDER — ISOSORBIDE MONONITRATE ER 60 MG PO TB24
60.0000 mg | ORAL_TABLET | Freq: Every day | ORAL | Status: DC
  Administered 2024-06-25 – 2024-06-28 (×4): 60 mg via ORAL
  Filled 2024-06-25 (×4): qty 1

## 2024-06-25 MED ORDER — AMLODIPINE BESYLATE 5 MG PO TABS
10.0000 mg | ORAL_TABLET | Freq: Every day | ORAL | Status: DC
  Administered 2024-06-26 – 2024-06-28 (×3): 10 mg via ORAL
  Filled 2024-06-25 (×3): qty 2

## 2024-06-25 MED ORDER — CARVEDILOL 12.5 MG PO TABS
25.0000 mg | ORAL_TABLET | Freq: Every day | ORAL | Status: DC
  Administered 2024-06-25 – 2024-06-28 (×4): 25 mg via ORAL
  Filled 2024-06-25 (×4): qty 2

## 2024-06-25 MED ORDER — IPRATROPIUM-ALBUTEROL 0.5-2.5 (3) MG/3ML IN SOLN: 3.0000 mL | Freq: Four times a day (QID) | RESPIRATORY_TRACT | Status: DC

## 2024-06-25 NOTE — ED Provider Notes (Addendum)
 [place] EMERGENCY DEPARTMENT AT [facility] Provider Note   CSN: [id number] Arrival date & time: 06/25/24  1137     Patient presents with: Chest Pain   Melvin Carney is a 73 y.o. male.    Chest Pain Patient presents with chest pain and shortness of breath.  History of COPD and end-stage renal disease on dialysis.  Has been more short of breath over the last few days.  Some chest pressure.  Went to dialysis today.  More tightness.  Presents here.  Does have oxygen at home.    Past Medical History:  Diagnosis Date   Arthritis    Chronic kidney disease    COPD (chronic obstructive pulmonary disease) (HCC)    Dyspnea    Headache    Hypercholesterolemia    Hyperlipidemia    Hypertension    PNA (pneumonia)     Prior to Admission medications   Medication Sig Start Date End Date Taking? Authorizing Provider  albuterol  (VENTOLIN  HFA) 108 (90 Base) MCG/ACT inhaler Inhale 2 puffs into the lungs every 6 (six) hours as needed for wheezing or shortness of breath.   Yes [provider]  amLODipine  (NORVASC ) 10 MG tablet Take 10 mg by mouth daily.   Yes [provider]  carvedilol  (COREG ) 25 MG tablet Take 25 mg by mouth daily.   Yes [provider]  fluticasone (FLONASE) 50 MCG/ACT nasal spray Place 2 sprays into both nostrils daily.   Yes [provider]  isosorbide  mononitrate (IMDUR ) 60 MG 24 hr tablet Take 60 mg by mouth daily.   Yes [provider]    Allergies: Patient has no known allergies.    Review of Systems  Cardiovascular:  Positive for chest pain.    Updated Vital Signs BP 134/86 (BP Location: Right Arm)   Pulse 80   Temp 98.2 F (36.8 C) (Temporal)   Resp (!) 24   Ht 5' 8 (1.727 m)   Wt 68.6 kg   SpO2 100%   BMI 23.00 kg/m   Physical Exam Vitals and nursing note reviewed.  Cardiovascular:     Rate and Rhythm: Normal rate.  Pulmonary:     Breath sounds: Rhonchi and rales present.  Chest:      Comments: Right chest wall catheter. Musculoskeletal:     Right lower leg: No edema.     Left lower leg: No edema.  Neurological:     Mental Status: He is alert.     (all labs ordered are listed, but only abnormal results are displayed) Labs Reviewed  CBC WITH DIFFERENTIAL/PLATELET - Abnormal; Notable for the following components:      Result Value   WBC 3.3 (*)    Hemoglobin 12.7 (*)    Platelets 129 (*)    All other components within normal limits  COMPREHENSIVE METABOLIC PANEL WITH GFR - Abnormal; Notable for the following components:   Potassium 3.3 (*)    Chloride 97 (*)    BUN 7 (*)    Creatinine, Ser 1.57 (*)    Calcium 7.9 (*)    Total Protein 5.9 (*)    Albumin 2.6 (*)    GFR, Estimated 47 (*)    All other components within normal limits  BRAIN NATRIURETIC PEPTIDE - Abnormal; Notable for the following components:   B Natriuretic Peptide 3,351.0 (*)    All other components within normal limits    EKG: EKG Interpretation Date/Time:  Thursday June 25 2024 11:50:31 EDT Ventricular Rate:  84 PR Interval:  160 QRS Duration:  88 QT Interval:  392 QTC Calculation: 463 R Axis:   -4  Text Interpretation: Sinus rhythm with Premature atrial complexes Nonspecific T wave abnormality Abnormal ECG When compared with ECG of 11-Jan-2024 14:08, Sinus rhythm has replaced Junctional rhythm QT has lengthened Confirmed by Patsey Lot [phone]) on 06/25/2024 12:56:57 PM  Radiology: [facility] Chest 2 View Result Date: 06/25/2024 EXAM: 2 VIEW(S) XRAY OF THE CHEST 06/25/2024 12:15:00 PM COMPARISON: None available. CLINICAL HISTORY: SOB. Table formatting from the original note was not included.; Images from the original note were not included.; Per triage; Pt arrived via POV following dialysis today. Pt has LUE Restriction. Pt reports SOB and chest pain X 2 days. FINDINGS: LINES, TUBES AND DEVICES: Right hemodialysis catheter in place with tip at cavoatrial junction. LUNGS AND PLEURA:  Mild pulmonary edema. Moderate bilateral pleural effusions with adjacent compressive atelectasis and/or airspace disease. No pneumothorax. HEART AND MEDIASTINUM: Cardiomegaly. BONES AND SOFT TISSUES: Thoracic spondylosis. IMPRESSION: 1. Mild pulmonary edema with moderate bilateral pleural effusions and adjacent compressive atelectasis and/or airspace disease. No pneumothorax. 2. Cardiomegaly. 3. Right hemodialysis catheter in place with tip at the cavoatrial junction. Electronically signed by: Waddell Calk MD 06/25/2024 12:37 PM EDT RP Workstation: [id number]     Procedures   Medications Ordered in the ED  ipratropium-albuterol  (DUONEB) 0.5-2.5 (3) MG/3ML nebulizer solution 3 mL (3 mLs Nebulization Given 06/25/24 1433)                                    Medical Decision Making Amount and/or Complexity of Data Reviewed Labs: ordered. Radiology: ordered.  Risk Prescription drug management. Decision regarding hospitalization.   Patient shortness of breath.  History of volume overload.  Also history of dialysis.  Appears more short of breath.  Has pleural effusions with pulmonary edema.  Despite dialysis today.   I think with underlying pulmonary disease and dialysis today would benefit from mission to the hospital for further workup and treatment.  May need further dialysis or other interventions such as thoracentesis.  Will discuss with hospitalist  Discussed with Dr. Ricky.  Recommends discussion with nephrology.  Have called Dr. Darden but unable to get through.  Dr. Ricky states that he needs the plan from nephrology before he can admit the patient.  States to re-call [facility] after discussion with nephrology.   discussed with Dr. Darden.  Can be admitted here and dialyze tomorrow.  Does not need transfer to [facility].  Will discuss with hospitalist who is now on-call.     Final diagnoses:  End stage renal disease on dialysis [facility])    ED Discharge Orders     None           Patsey Lot, MD 06/25/24 1426    Patsey Lot, MD 06/25/24 1505    Patsey Lot, MD 06/25/24 1510

## 2024-06-25 NOTE — ED Notes (Signed)
 Pt does not want lasix  until he is in a room with a bathroom.

## 2024-06-25 NOTE — ED Notes (Signed)
 Pt placed on 2L Nasal Cannula at this time.

## 2024-06-25 NOTE — H&P (Signed)
 [facility] H&P   Patient Demographics:    Melvin Carney, is a 73 y.o. male  MRN: [id number]   DOB - 12-02-1950  Admit Date - 06/25/2024  Outpatient Primary MD for the patient is Orpha Yancey LABOR, MD  Referring MD/NP/PA: Dr. Patsey  Outpatient Specialists: Nephrology in ED and  Patient coming from: HD center  Chief Complaint  Patient presents with   Chest Pain      HPI:    Melvin Carney  is a 73 y.o. 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 - Patient was sent by his HD center for shortness of breath, patient reports he is compliant with his dialysis, fluid restriction and medication, he has been having some loss of breath over the last couple of days, he finished his entire HD session today without improvement of his dyspnea, so he was sent to ED for further evaluation, denies chest pain, but reports chest tightness, related to dyspnea, denies any dizziness, fever or chills. - In ED chest x-ray significant for volume overload, with bilateral pleural effusion, and P significantly elevated at 3351, potassium 3.3, creatinine 1.57, EKG nonacute, ED discussed with nephrology on-call who reported patient can stay at [facility] and extra HD can be arranged, [facility] hospitalist consulted to admit for volume overload    Review of systems:     A full 10 point Review of Systems was done, except as stated above, all other Review of Systems were negative.   With Past History of the following :    Past Medical History:  Diagnosis Date   Arthritis    Chronic kidney disease    COPD (chronic obstructive pulmonary disease) (HCC)    Dyspnea    Headache    Hypercholesterolemia    Hyperlipidemia    Hypertension    PNA (pneumonia)       Past Surgical History:  Procedure  Laterality Date   BASCILIC VEIN TRANSPOSITION Left 04/15/2024   Procedure: TRANSPOSITION, VEIN, BASILIC;  Surgeon: Serene Gaile ORN, MD;  Location: [facility] OR;  Service: Vascular;  Laterality: Left;   COLONOSCOPY WITH PROPOFOL  N/A 03/17/2018   Procedure: COLONOSCOPY WITH PROPOFOL ;  Surgeon: Viktoria Lamar DASEN, MD;  Location: [facility] ENDOSCOPY;  Service: Endoscopy;  Laterality: N/A;   Right Knee Ligament Repair        Social History:     Social History   Tobacco Use   Smoking status: Some Days    Current packs/day: 0.50    Average packs/day: 0.5 packs/day for 50.0 years (25.0 ttl pk-yrs)    Types: Cigarettes   Smokeless tobacco: Never  Substance Use Topics   Alcohol use: Not Currently    Comment: 4-5 cans of beer per week       Family History :    History reviewed. No pertinent family history.  Home Medications:   Prior to Admission medications   Medication Sig Start Date End Date Taking? Authorizing Provider  albuterol  (VENTOLIN  HFA) 108 (90 Base) MCG/ACT inhaler Inhale 2 puffs into the lungs every 6 (six) hours as needed for wheezing or shortness of breath.   Yes [provider]  amLODipine  (NORVASC ) 10 MG tablet Take 10 mg by mouth daily.   Yes [provider]  carvedilol  (COREG ) 25 MG tablet Take 25 mg by mouth daily.   Yes [provider]  fluticasone (FLONASE) 50 MCG/ACT nasal spray Place 2 sprays into both nostrils daily.   Yes [provider]  isosorbide  mononitrate (IMDUR ) 60 MG 24 hr tablet Take 60 mg by mouth daily.   Yes [provider]     Allergies:    No Known Allergies   Physical Exam:   Vitals  Blood pressure 134/86, pulse 80, temperature 98.2 F (36.8 C), temperature source Temporal, resp. rate (!) 24, height 5' 8 (1.727 m), weight 68.6 kg, SpO2 100%.   1. General: Male, lying in bed, no apparent distress  2. Normal affect and insight, Not Suicidal or Homicidal, Awake Alert, Oriented X 3.  3. No F.N  deficits, ALL C.Nerves Intact, Strength 5/5 all 4 extremities, Sensation intact all 4 extremities, Plantars down going.  4. Ears and Eyes appear Normal, Conjunctivae clear, PERRLA. Moist Oral Mucosa.  5. Supple Neck, No cervical lymphadenopathy appriciated, No Carotid Bruits.  6. Symmetrical Chest wall movement, manage air entry at the bases, scattered crackles, +2 edema.  7. RRR, No Gallops, Rubs or Murmurs, No Parasternal Heave.  8. Positive Bowel Sounds, Abdomen Soft, No tenderness, No organomegaly appriciated,No rebound -guarding or rigidity.  9.  No Cyanosis, Normal Skin Turgor, No Skin Rash or Bruise.  10. Good muscle tone,  joints appear normal , no effusions, Normal ROM.    Data Review:    CBC Recent Labs  Lab 06/25/24 1226  WBC 3.3*  HGB 12.7*  HCT 39.0  PLT 129*  MCV 91.1  MCH 29.7  MCHC 32.6  RDW 14.8  LYMPHSABS 1.0  MONOABS 0.5  EOSABS 0.1  BASOSABS 0.0   ------------------------------------------------------------------------------------------------------------------  Chemistries  Recent Labs  Lab 06/25/24 1226  NA 137  K 3.3*  CL 97*  CO2 29  GLUCOSE 99  BUN 7*  CREATININE 1.57*  CALCIUM 7.9*  AST 19  ALT 12  ALKPHOS 87  BILITOT 0.6   ------------------------------------------------------------------------------------------------------------------ estimated creatinine clearance is 41.1 mL/min (A) (by C-G formula based on SCr of 1.57 mg/dL (H)). ------------------------------------------------------------------------------------------------------------------ No results for input(s): TSH, T4TOTAL, T3FREE, THYROIDAB in the last 72 hours.  Invalid input(s): FREET3  Coagulation profile No results for input(s): INR, PROTIME in the last 168 hours. ------------------------------------------------------------------------------------------------------------------- No results for input(s): DDIMER in the last 72  hours. -------------------------------------------------------------------------------------------------------------------  Cardiac Enzymes No results for input(s): CKMB, TROPONINI, MYOGLOBIN in the last 168 hours.  Invalid input(s): CK ------------------------------------------------------------------------------------------------------------------    Component Value Date/Time   BNP 3,351.0 (H) 06/25/2024 1226     ---------------------------------------------------------------------------------------------------------------  Urinalysis No results found for: COLORURINE, APPEARANCEUR, LABSPEC, PHURINE, GLUCOSEU, HGBUR, BILIRUBINUR, KETONESUR, PROTEINUR, UROBILINOGEN, NITRITE, LEUKOCYTESUR  ----------------------------------------------------------------------------------------------------------------   Imaging Results:    DG Chest 2 View Result Date: 06/25/2024 EXAM: 2 VIEW(S) XRAY OF THE CHEST 06/25/2024 12:15:00 PM COMPARISON: None available. CLINICAL HISTORY: SOB. Table formatting from the original note was not included.; Images from the original note were not included.; Per triage; Pt arrived via POV following dialysis today. Pt has LUE Restriction. Pt  reports SOB and chest pain X 2 days. FINDINGS: LINES, TUBES AND DEVICES: Right hemodialysis catheter in place with tip at cavoatrial junction. LUNGS AND PLEURA: Mild pulmonary edema. Moderate bilateral pleural effusions with adjacent compressive atelectasis and/or airspace disease. No pneumothorax. HEART AND MEDIASTINUM: Cardiomegaly. BONES AND SOFT TISSUES: Thoracic spondylosis. IMPRESSION: 1. Mild pulmonary edema with moderate bilateral pleural effusions and adjacent compressive atelectasis and/or airspace disease. No pneumothorax. 2. Cardiomegaly. 3. Right hemodialysis catheter in place with tip at the cavoatrial junction. Electronically signed by: Waddell Calk MD 06/25/2024 12:37 PM EDT RP Workstation:  [id number]     EKG: PR interval 160 ms QRS duration 88 ms QT/QTcB 392/463 ms P-R-T axes 33 -4 100 Sinus rhythm with Premature atrial complexes Nonspecific T wave abnormality Abnormal ECG When compared with ECG of 11-Jan-2024 14:08, Sinus rhythm has replaced Junctional rhythm QT has lengthened   Assessment & Plan:    Principal Problem:   Acute on chronic combined systolic and diastolic CHF (congestive heart failure) (HCC) Active Problems:   ESRD (end stage renal disease) (HCC)   Essential hypertension   Chronic respiratory failure with hypoxia Dyspnea Acute on chronic combined diastolic/systolic CHF Bilateral pleural effusion - Presents with dyspnea, workup significant for volume overload, significantly elevated BNP, imaging significant for volume overload and bilateral pleural effusion.  History of chronic systolic/diastolic CHF, with the echo at [facility] showing EF 40 to 45%. - He finished his HD today, he reports compliance with fluid restriction, medication and dialysis. - Likely will need extra session of dialysis, and dry weight adjusted. - Status likely will need HD tomorrow. - Patient reports he makes urine, daily low amount, will try 1 dose of Lasix .  ESRD - On TTS schedule, likely will need extra dialysis tomorrow, renal consulted - He is dialyzed via mild dialysis catheter via right upper chest, he is having left upper arm fistula, says is not ready yet for dialysis  History of ANCA associated vasculitis - Reports he finished his prednisone taper   COPD - No active wheezing, continue with scheduled DuoNebs and as needed albuterol   Tobacco use - Patient reports he cut down to only 1 cigarette daily he was counseled about total cessation  Hypertension - Continue with home medication, will hold Norvasc  today to allow more room for diuresis if possible  CAD Chest pain - Initial  concern for chest pain on presentation, but upon asking patient  he denies any chest pain, reports it is mainly dyspnea and cough, trend troponins -Continue with beta-blockers, will start on aspirin, appears he is not on statin anymore  Subclinical hypothyroidism - Reviewing previous records it does appear at one point he was on Synthroid, but he reports he is not on it anymore  Hyperlipidemia - Reports he is on a statin no more   DVT Prophylaxis Heparin    AM Labs Ordered, also please review Full Orders  Family Communication: Admission, patients condition and plan of care including tests being ordered have been discussed with the patient who indicate understanding and agree with the plan and Code Status.  Code Status full code  Likely DC to home inpatient  Consults called: Renal by ED  Admission status: inpatient  Time spent in minutes : 70 minutes   Brayton Lye M.D on 06/25/2024 at 3:58 PM   [facility] - Office  [phone]

## 2024-06-25 NOTE — ED Triage Notes (Signed)
 Pt arrived via POV following dialysis today. Pt has LUE Restriction. Pt reports SOB and chest pain X 2 days.

## 2024-06-26 DIAGNOSIS — N186 End stage renal disease: Secondary | ICD-10-CM

## 2024-06-26 DIAGNOSIS — I10 Essential (primary) hypertension: Secondary | ICD-10-CM

## 2024-06-26 LAB — CBC
HCT: 32.3 % — ABNORMAL LOW (ref 39.0–52.0)
Hemoglobin: 10.3 g/dL — ABNORMAL LOW (ref 13.0–17.0)
MCH: 28.9 pg (ref 26.0–34.0)
MCHC: 31.9 g/dL (ref 30.0–36.0)
MCV: 90.5 fL (ref 80.0–100.0)
Platelets: 116 K/uL — ABNORMAL LOW (ref 150–400)
RBC: 3.57 MIL/uL — ABNORMAL LOW (ref 4.22–5.81)
RDW: 14.6 % (ref 11.5–15.5)
WBC: 3.2 K/uL — ABNORMAL LOW (ref 4.0–10.5)
nRBC: 0 % (ref 0.0–0.2)

## 2024-06-26 LAB — RENAL FUNCTION PANEL
Albumin: 2.2 g/dL — ABNORMAL LOW (ref 3.5–5.0)
Anion gap: 7 (ref 5–15)
BUN: 15 mg/dL (ref 8–23)
CO2: 28 mmol/L (ref 22–32)
Calcium: 7.6 mg/dL — ABNORMAL LOW (ref 8.9–10.3)
Chloride: 102 mmol/L (ref 98–111)
Creatinine, Ser: 2.57 mg/dL — ABNORMAL HIGH (ref 0.61–1.24)
GFR, Estimated: 26 mL/min — ABNORMAL LOW (ref >60)
Glucose, Bld: 131 mg/dL — ABNORMAL HIGH (ref 70–99)
Phosphorus: 2.7 mg/dL (ref 2.5–4.6)
Potassium: 3 mmol/L — ABNORMAL LOW (ref 3.5–5.1)
Sodium: 137 mmol/L (ref 135–145)

## 2024-06-26 LAB — HEPATITIS B SURFACE ANTIGEN: Hepatitis B Surface Ag: NONREACTIVE

## 2024-06-26 MED ORDER — HEPARIN SODIUM (PORCINE) 1000 UNIT/ML IJ SOLN
1000.0000 [IU] | Freq: Once | INTRAMUSCULAR | Status: AC
  Administered 2024-06-26: 3600 [IU]

## 2024-06-26 MED ORDER — IPRATROPIUM-ALBUTEROL 0.5-2.5 (3) MG/3ML IN SOLN
3.0000 mL | Freq: Four times a day (QID) | RESPIRATORY_TRACT | Status: DC
  Administered 2024-06-26 (×2): 3 mL via RESPIRATORY_TRACT
  Filled 2024-06-26 (×2): qty 3

## 2024-06-26 MED ORDER — HEPARIN SODIUM (PORCINE) 1000 UNIT/ML IJ SOLN
INTRAMUSCULAR | Status: AC
  Filled 2024-06-26: qty 4

## 2024-06-26 MED ORDER — CHLORHEXIDINE GLUCONATE CLOTH 2 % EX PADS
6.0000 | MEDICATED_PAD | Freq: Every day | CUTANEOUS | Status: DC
  Administered 2024-06-26 – 2024-06-28 (×3): 6 via TOPICAL

## 2024-06-26 NOTE — Progress Notes (Signed)
 Pt receives out-pt HD at [facility], TTS, [phone] chair time. Will continue to assist as needed.   Lavanda Taura Lamarre Dialysis Navigator [phone]

## 2024-06-26 NOTE — Procedures (Signed)
 Received patient in bed to unit.  Alert and oriented.  Informed consent signed and in chart.  1600 RIJ tunneled dual lumen catheter accessed per policy, without difficulty. Tx initiated per MD order. UF goal 1000 ml.  TX duration:4 hours Tx complete. Blood returned. Dual lumen catheter flushed with NS, heparin  left to dwell. Caps and clamps in place.  Patient tolerated well.  Transported back to the room  Alert, without acute distress.  Hand-off given to patient's nurse.   Access used: RIJ tunneled dual lumen catheter Access issues: none  Total UF removed: 1000 Medication(s) given: See MAR   [facility]

## 2024-06-26 NOTE — Progress Notes (Signed)
 Progress Note   Patient: Melvin Carney FMW:[id number] DOB: Aug 15, 1951 DOA: 06/25/2024     1 DOS: the patient was seen and examined on 06/26/2024   Brief hospital admission narrative: As per H&P written by Dr. Sherlon on 06/25/2024 Melvin Carney  is a 73 y.o. 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 - Patient was sent by his HD center for shortness of breath, patient reports he is compliant with his dialysis, fluid restriction and medication, he has been having some loss of breath over the last couple of days, he finished his entire HD session today without improvement of his dyspnea, so he was sent to ED for further evaluation, denies chest pain, but reports chest tightness, related to dyspnea, denies any dizziness, fever or chills. - In ED chest x-ray significant for volume overload, with bilateral pleural effusion, and P significantly elevated at 3351, potassium 3.3, creatinine 1.57, EKG nonacute, ED discussed with nephrology on-call who reported patient can stay at [facility] and extra HD can be arranged, [facility] hospitalist consulted to admit for volume overload.  Assessment and plan 1-chronic respiratory failure with hypoxia - Patient reports using 2 L nasal cannula supplementation at home. - Feeling more short of breath on presentation. - Workup at time of admission demonstrating vascular congestion and significant elevation in his BNP - Volume management with dialysis - Nephrology service has been consulted and will follow recommendations.  2-acute on chronic combined diastolic and systolic heart failure - Low-sodium diet discussed with patient - Maintain adequate hydration - Follow daily weights/strict I's and O's - Continue volume management with dialysis.  3-ESRD - On TTS schedule - Discussed with nephrology service and will provide back-to-back dialysis (today  9/26 and tomorrow 9/27); hopefully discharge after volume optimization.  4-history of COPD - No active wheezing appreciated - Continue home bronchodilator management.  5-history of tobacco abuse - Cessation counseling provided - Patient declined nicotine patch and expressed that he had cut down to just 1 cigarette/day.  6-hypertension - Continue home antihypertensive agents. - Follow vital signs.  7-hyperlipidemia - Patient reports no longer taking statins.  8-history of coronary artery disease - Continue beta-blockers and aspirin - Continue outpatient follow-up with cardiology service - Telemetry not demonstrating acute ischemic changes.  9-hypokalemia - Potassium down to 3.0 - Low dose provided for repletion - Further management per dialysis.   Subjective:  No chest pain, no nausea, no vomiting.  Still feels short winded with activity.  Physical Exam: Vitals:   06/26/24 0754 06/26/24 0909 06/26/24 1330 06/26/24 1409  BP:  113/74 102/60   Pulse:  75 72   Resp:  (!) 22 (!) 25   Temp:  98.2 F (36.8 C) 98.1 F (36.7 C)   TempSrc:  Oral Oral   SpO2: 91% 98% 98% 95%  Weight:      Height:       General exam: Alert, awake, oriented x 3; following commands appropriately and in no acute distress. Respiratory system: Decreased breath sounds at the bases with scattered rhonchi on exam.  No wheezing. Cardiovascular system:RRR. No rubs or gallops; no JVD. Gastrointestinal system: Abdomen is nondistended, soft and nontender. No organomegaly or masses felt. Normal bowel sounds heard. Central nervous system: No focal neurological deficits. Extremities: No cyanosis or clubbing; trace edema appreciated on exam. Skin: No rashes, no petechiae. Psychiatry: Judgement and insight appear normal. Mood & affect appropriate.    Data Reviewed:  CBC: WBC 3.2, hemoglobin 10.3 and platelet count 1 16K Renal function panel: Sodium 137, potassium 3.0, chloride 102, bicarb 28, BUN 15,  creatinine 2.57 and GFR 26  Family Communication: Family at bedside.  Disposition: Status is: Inpatient Remains inpatient appropriate because: Continue further volume management with hemodialysis.  Anticipating discharge back home once medically stable.  Time spent: 50 minutes  Author: Eric Nunnery, MD 06/26/2024 3:39 PM  For on call review [email or web address].

## 2024-06-26 NOTE — TOC CM/SW Note (Signed)
 Transition of Care [facility]) - Inpatient Brief Assessment   Patient Details  Name: Alika Saladin MRN: [id number] Date of Birth: [birth year]  Transition of [facility]) CM/SW Contact:    Lucie Lunger, LCSWA Phone Number: 06/26/2024, 8:16 AM   Clinical Narrative: Transition of Care Department [facility]) has reviewed patient and no TOC needs have been identified at this time. We will continue to monitor patient advancement through interdiciplinary progression rounds. If new patient transition needs arise, please place a TOC consult.  Transition of Care Asessment: Insurance and Status: Insurance coverage has been reviewed Patient has primary care physician: Yes Home environment has been reviewed: From home Prior level of function:: Independent Prior/Current Home Services: No current home services Social Drivers of Health Review: SDOH reviewed no interventions necessary Readmission risk has been reviewed: Yes Transition of care needs: no transition of care needs at this time

## 2024-06-26 NOTE — Plan of Care (Signed)
  Problem: Education: Goal: Knowledge of General Education information will improve Description: Including pain rating scale, medication(s)/side effects and non-pharmacologic comfort measures Outcome: Progressing   Problem: Clinical Measurements: Goal: Ability to maintain clinical measurements within normal limits will improve Outcome: Progressing Goal: Will remain free from infection Outcome: Progressing Goal: Diagnostic test results will improve Outcome: Progressing Goal: Respiratory complications will improve Outcome: Progressing Goal: Cardiovascular complication will be avoided Outcome: Progressing   Problem: Activity: Goal: Risk for activity intolerance will decrease Outcome: Progressing   Problem: Nutrition: Goal: Adequate nutrition will be maintained Outcome: Progressing   Problem: Coping: Goal: Level of anxiety will decrease Outcome: Progressing   Problem: Elimination: Goal: Will not experience complications related to bowel motility Outcome: Progressing Goal: Will not experience complications related to urinary retention Outcome: Progressing   Problem: Pain Managment: Goal: General experience of comfort will improve and/or be controlled Outcome: Progressing   Problem: Safety: Goal: Ability to remain free from injury will improve Outcome: Progressing   Problem: Skin Integrity: Goal: Risk for impaired skin integrity will decrease Outcome: Progressing   Problem: Education: Goal: Ability to demonstrate management of disease process will improve Outcome: Progressing Goal: Ability to verbalize understanding of medication therapies will improve Outcome: Progressing Goal: Individualized Educational Video(s) Outcome: Progressing   Problem: Activity: Goal: Capacity to carry out activities will improve Outcome: Progressing   Problem: Cardiac: Goal: Ability to achieve and maintain adequate cardiopulmonary perfusion will improve Outcome: Progressing

## 2024-06-26 NOTE — Consult Note (Signed)
 Reason for Consult: To manage dialysis and dialysis related needs Referring Physician: Elgergawy, Brayton RAMAN, MD   Melvin Carney is an 73 y.o. male.  HPI: 89M PMH chronic diastolic CHF, COPD, chronic hypoxic respiratory failure on oxygen at home, ESRD, ANCA vasculitis, hyperlipidemia, CKD, started HD this April.  Admitted to [facility] from HD due to SOB.  Adherent to HD schedule, normally TTS.   Seen in bed.  On 3L [place].  Conversational.  Feels well.  All questions answered.  Dialyzes at [facility] EDW 73kg. HD Bath 2k/2.5Ca, Dialyzer Elisio 17H, BFR 400, DFR 500, Heparin  Loading 1000u, hourly 500u/hr; 1800 post in [facility]. Access R-TDC.   Past Medical History:  Diagnosis Date   Arthritis    Chronic kidney disease    COPD (chronic obstructive pulmonary disease) (HCC)    Dyspnea    Headache    Hypercholesterolemia    Hyperlipidemia    Hypertension    PNA (pneumonia)     Past Surgical History:  Procedure Laterality Date   BASCILIC VEIN TRANSPOSITION Left 04/15/2024   Procedure: TRANSPOSITION, VEIN, BASILIC;  Surgeon: Serene Gaile ORN, MD;  Location: [facility] OR;  Service: Vascular;  Laterality: Left;   COLONOSCOPY WITH PROPOFOL  N/A 03/17/2018   Procedure: COLONOSCOPY WITH PROPOFOL ;  Surgeon: Viktoria Lamar DASEN, MD;  Location: [facility] ENDOSCOPY;  Service: Endoscopy;  Laterality: N/A;   Right Knee Ligament Repair      History reviewed. No pertinent family history.  Social History:  reports that he has been smoking cigarettes. He has a 25 pack-year smoking history. He has never used smokeless tobacco. He reports that he does not currently use alcohol. He reports that he does not currently use drugs after having used the following drugs: Marijuana and Cocaine.  Allergies: No Known Allergies  Medications: I have reviewed the patient's current medications. Venofer 50mg  q week, Tuesdays  Results for orders placed or performed during the hospital encounter of 06/25/24 (from the past 48 hours)  CBC with  Differential     Status: Abnormal   Collection Time: 06/25/24 12:26 PM  Result Value Ref Range   WBC 3.3 (L) 4.0 - 10.5 K/uL   RBC 4.28 4.22 - 5.81 MIL/uL   Hemoglobin 12.7 (L) 13.0 - 17.0 g/dL   HCT 60.9 60.9 - 47.9 %   MCV 91.1 80.0 - 100.0 fL   MCH 29.7 26.0 - 34.0 pg   MCHC 32.6 30.0 - 36.0 g/dL   RDW 85.1 88.4 - 84.4 %   Platelets 129 (L) 150 - 400 K/uL   nRBC 0.0 0.0 - 0.2 %   Neutrophils Relative % 50 %   Neutro Abs 1.7 1.7 - 7.7 K/uL   Lymphocytes Relative 32 %   Lymphs Abs 1.0 0.7 - 4.0 K/uL   Monocytes Relative 15 %   Monocytes Absolute 0.5 0.1 - 1.0 K/uL   Eosinophils Relative 3 %   Eosinophils Absolute 0.1 0.0 - 0.5 K/uL   Basophils Relative 0 %   Basophils Absolute 0.0 0.0 - 0.1 K/uL   Immature Granulocytes 0 %   Abs Immature Granulocytes 0.00 0.00 - 0.07 K/uL    Comment: Performed at [facility], [address] [place], [place] [address]  Comprehensive metabolic panel     Status: Abnormal   Collection Time: 06/25/24 12:26 PM  Result Value Ref Range   Sodium 137 135 - 145 mmol/L   Potassium 3.3 (L) 3.5 - 5.1 mmol/L   Chloride 97 (L) 98 - 111 mmol/L  CO2 29 22 - 32 mmol/L   Glucose, Bld 99 70 - 99 mg/dL    Comment: Glucose reference range applies only to samples taken after fasting for at least 8 hours.   BUN 7 (L) 8 - 23 mg/dL   Creatinine, Ser 8.42 (H) 0.61 - 1.24 mg/dL   Calcium 7.9 (L) 8.9 - 10.3 mg/dL   Total Protein 5.9 (L) 6.5 - 8.1 g/dL   Albumin 2.6 (L) 3.5 - 5.0 g/dL   AST 19 15 - 41 U/L   ALT 12 0 - 44 U/L   Alkaline Phosphatase 87 38 - 126 U/L   Total Bilirubin 0.6 0.0 - 1.2 mg/dL   GFR, Estimated 47 (L) >60 mL/min    Comment: (NOTE) Calculated using the CKD-EPI Creatinine Equation (2021)    Anion gap 11 5 - 15    Comment: Performed at [facility], [address], [place], [place] [address]  BNP (Order if Patient has history of Heart Failure)     Status: Abnormal   Collection Time: 06/25/24 12:26 PM  Result Value Ref Range   B Natriuretic  Peptide 3,351.0 (H) 0.0 - 100.0 pg/mL    Comment: Performed at [facility], [address] [place], [place] [address]  Troponin I (High Sensitivity)     Status: Abnormal   Collection Time: 06/25/24 12:26 PM  Result Value Ref Range   Troponin I (High Sensitivity) 37 (H) <18 ng/L    Comment: (NOTE) Elevated high sensitivity troponin I (hsTnI) values and significant  changes across serial measurements may suggest ACS but many other  chronic and acute conditions are known to elevate hsTnI results.  Refer to the Links section for chest pain algorithms and additional  guidance. Performed at [facility], [address], [place], [place] [address]   Troponin I (High Sensitivity)     Status: Abnormal   Collection Time: 06/25/24  6:35 PM  Result Value Ref Range   Troponin I (High Sensitivity) 38 (H) <18 ng/L    Comment: (NOTE) Elevated high sensitivity troponin I (hsTnI) values and significant  changes across serial measurements may suggest ACS but many other  chronic and acute conditions are known to elevate hsTnI results.  Refer to the Links section for chest pain algorithms and additional  guidance. Performed at [facility], [address], [place], [place] [address]   CBC     Status: Abnormal   Collection Time: 06/26/24  4:59 AM  Result Value Ref Range   WBC 3.2 (L) 4.0 - 10.5 K/uL   RBC 3.57 (L) 4.22 - 5.81 MIL/uL   Hemoglobin 10.3 (L) 13.0 - 17.0 g/dL   HCT 67.6 (L) 60.9 - 47.9 %   MCV 90.5 80.0 - 100.0 fL   MCH 28.9 26.0 - 34.0 pg   MCHC 31.9 30.0 - 36.0 g/dL   RDW 85.3 88.4 - 84.4 %   Platelets 116 (L) 150 - 400 K/uL   nRBC 0.0 0.0 - 0.2 %    Comment: Performed at [facility], [address], [place], [place] [address]  Renal function panel     Status: Abnormal   Collection Time: 06/26/24  4:59 AM  Result Value Ref Range   Sodium 137 135 - 145 mmol/L   Potassium 3.0 (L) 3.5 - 5.1 mmol/L   Chloride 102 98 - 111 mmol/L   CO2 28 22 - 32 mmol/L   Glucose, Bld 131 (H) 70  - 99 mg/dL    Comment: Glucose reference range  applies only to samples taken after fasting for at least 8 hours.   BUN 15 8 - 23 mg/dL   Creatinine, Ser 7.42 (H) 0.61 - 1.24 mg/dL    Comment: DELTA CHECK NOTED   Calcium 7.6 (L) 8.9 - 10.3 mg/dL   Phosphorus 2.7 2.5 - 4.6 mg/dL   Albumin 2.2 (L) 3.5 - 5.0 g/dL   GFR, Estimated 26 (L) >60 mL/min    Comment: (NOTE) Calculated using the CKD-EPI Creatinine Equation (2021)    Anion gap 7 5 - 15    Comment: Performed at [facility], [address] [place], [place] [address]    DG Chest 2 View Result Date: 06/25/2024 EXAM: 2 VIEW(S) XRAY OF THE CHEST 06/25/2024 12:15:00 PM COMPARISON: None available. CLINICAL HISTORY: SOB. Table formatting from the original note was not included.; Images from the original note were not included.; Per triage; Pt arrived via POV following dialysis today. Pt has LUE Restriction. Pt reports SOB and chest pain X 2 days. FINDINGS: LINES, TUBES AND DEVICES: Right hemodialysis catheter in place with tip at cavoatrial junction. LUNGS AND PLEURA: Mild pulmonary edema. Moderate bilateral pleural effusions with adjacent compressive atelectasis and/or airspace disease. No pneumothorax. HEART AND MEDIASTINUM: Cardiomegaly. BONES AND SOFT TISSUES: Thoracic spondylosis. IMPRESSION: 1. Mild pulmonary edema with moderate bilateral pleural effusions and adjacent compressive atelectasis and/or airspace disease. No pneumothorax. 2. Cardiomegaly. 3. Right hemodialysis catheter in place with tip at the cavoatrial junction. Electronically signed by: Waddell Calk MD 06/25/2024 12:37 PM EDT RP Workstation: [id number]    ROS: No N/V/SOB/CP/edema Blood pressure 113/74, pulse 75, temperature 98.2 F (36.8 C), temperature source Oral, resp. rate (!) 22, height 5' 8 (1.727 m), weight 68.6 kg, SpO2 98%. General appearance: NAD, 3LNC Resp: BLBS Cardio: RRR Extremities: No edema R-TDC  Assessment/Plan: 1 Volume overload: Plan extra HD  session today. 2 ESRD: TTS.  Plan extra HD session today.  Plan routine HD tomorrow then likely discharge. 3 Hypertension: Controlled 4. Anemia of ESRD: Weekly Venofer, on Tuesdays.  Monitor. 5. Metabolic Bone Disease: Monitor   Melvin Carney 06/26/2024, 9:37 AM

## 2024-06-26 NOTE — Plan of Care (Signed)

## 2024-06-27 DIAGNOSIS — J441 Chronic obstructive pulmonary disease with (acute) exacerbation: Secondary | ICD-10-CM

## 2024-06-27 LAB — HEPATITIS B SURFACE ANTIBODY, QUANTITATIVE: Hep B S AB Quant (Post): <3.5 m[IU]/mL — ABNORMAL LOW

## 2024-06-27 MED ORDER — IPRATROPIUM-ALBUTEROL 0.5-2.5 (3) MG/3ML IN SOLN
3.0000 mL | Freq: Two times a day (BID) | RESPIRATORY_TRACT | Status: DC
  Administered 2024-06-27 – 2024-06-28 (×2): 3 mL via RESPIRATORY_TRACT
  Filled 2024-06-27 (×2): qty 3

## 2024-06-27 MED ORDER — HEPARIN SODIUM (PORCINE) 1000 UNIT/ML IJ SOLN
INTRAMUSCULAR | Status: AC
  Filled 2024-06-27: qty 4

## 2024-06-27 MED ORDER — HEPARIN SODIUM (PORCINE) 1000 UNIT/ML IJ SOLN
3200.0000 [IU] | Freq: Once | INTRAMUSCULAR | Status: AC
  Administered 2024-06-27: 3200 [IU] via INTRAVENOUS

## 2024-06-27 MED ORDER — DM-GUAIFENESIN ER 30-600 MG PO TB12
1.0000 | ORAL_TABLET | Freq: Two times a day (BID) | ORAL | Status: DC
  Administered 2024-06-27 – 2024-06-28 (×3): 1 via ORAL
  Filled 2024-06-27 (×3): qty 1

## 2024-06-27 MED ORDER — PREDNISONE 20 MG PO TABS
40.0000 mg | ORAL_TABLET | Freq: Every day | ORAL | Status: DC
  Administered 2024-06-27 – 2024-06-28 (×2): 40 mg via ORAL
  Filled 2024-06-27 (×2): qty 2

## 2024-06-27 MED ORDER — BUDESONIDE 0.5 MG/2ML IN SUSP
0.5000 mg | Freq: Two times a day (BID) | RESPIRATORY_TRACT | Status: DC
  Administered 2024-06-27 – 2024-06-28 (×2): 0.5 mg via RESPIRATORY_TRACT
  Filled 2024-06-27 (×2): qty 2

## 2024-06-27 NOTE — Plan of Care (Signed)

## 2024-06-27 NOTE — Discharge Instructions (Signed)
 Follow up with your Primary Care Physician to get assistance with getting POC eval completed.

## 2024-06-27 NOTE — Progress Notes (Signed)
 The patient completed dialysis treatment without issue.   06/27/24 2016  Vitals  Temp 98.2 F (36.8 C)  Temp Source Oral  BP (!) 141/84  MAP (mmHg) 102  BP Location Right Arm  BP Method Automatic  Pulse Rate 81  Resp 16  Oxygen Therapy  SpO2 100 %  O2 Device Nasal Cannula  O2 Flow Rate (L/min) 3 L/min  During Treatment Monitoring  Intra-Hemodialysis Comments Tx completed  Post Treatment  Dialyzer Clearance Lightly streaked  Hemodialysis Intake (mL) 0 mL  Liters Processed 70  Fluid Removed (mL) 1500 mL  Tolerated HD Treatment Yes  Post-Hemodialysis Comments goal met.

## 2024-06-27 NOTE — Progress Notes (Signed)
 Progress Note   Patient: Melvin Carney FMW:[id number] DOB: [birth year] DOA: 06/25/2024     2 DOS: the patient was seen and examined on 06/27/2024   Brief hospital admission narrative: As per H&P written by Dr. Sherlon on 06/25/2024 Melvin Carney  is a 73 y.o. 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 - Patient was sent by his HD center for shortness of breath, patient reports he is compliant with his dialysis, fluid restriction and medication, he has been having some loss of breath over the last couple of days, he finished his entire HD session today without improvement of his dyspnea, so he was sent to ED for further evaluation, denies chest pain, but reports chest tightness, related to dyspnea, denies any dizziness, fever or chills. - In ED chest x-ray significant for volume overload, with bilateral pleural effusion, and P significantly elevated at 3351, potassium 3.3, creatinine 1.57, EKG nonacute, ED discussed with nephrology on-call who reported patient can stay at [facility] and extra HD can be arranged, [facility] hospitalist consulted to admit for volume overload.  Assessment and plan 1-chronic respiratory failure with hypoxia - Patient reports using 2 L nasal cannula supplementation at home. - Feeling more short of breath on presentation. - Workup at time of admission demonstrating vascular congestion and significant elevation in his BNP - Continue volume management with dialysis - Nephrology service has been consulted and will follow recommendations.  2-acute on chronic combined diastolic and systolic heart failure - Low-sodium diet discussed with patient - Maintain adequate hydration - Follow daily weights/strict I's and O's - Continue volume management with dialysis.  3-ESRD - On TTS schedule - Discussed with nephrology service, plan is for back-to-back dialysis, he  had tx on 9/26 and will have HD again today 9/27 in order to get patient back on his usual dialysis schedule and further stabilize volume. - Anticipate discharge on 06/28/2024.  4-history of COPD with mild exacerbation - Some wheezing appreciated on today's examination - Patient mild difficulty speaking in full sentences - Will provide short course of steroids and to start treatment with Pulmicort - Continue home bronchodilator management.  5-history of tobacco abuse - Cessation counseling provided - Patient declined nicotine patch and expressed that he had cut down to just 1 cigarette/day.  6-hypertension - Continue home antihypertensive agents. - Follow vital signs.  7-hyperlipidemia - Patient reports no longer taking statins.  8-history of coronary artery disease - Continue beta-blockers and aspirin - Continue outpatient follow-up with cardiology service - Telemetry not demonstrating acute ischemic changes.  9-hypokalemia - Potassium down to 3.0 - Low dose provided for repletion - Further management per dialysis.   Subjective:  No fever, no chest pain, no nausea, no vomiting.  Reports mild difficulty speaking in full sentences, still feeling SOB with activity and having no orthopnea.  Physical Exam: Vitals:   06/27/24 0522 06/27/24 0825 06/27/24 1039 06/27/24 1414  BP: (!) 138/93 (!) 144/85  104/71  Pulse: 81   77  Resp: 18   18  Temp: 98 F (36.7 C)   98.7 F (37.1 C)  TempSrc: Oral   Oral  SpO2: 98%  100% 96%  Weight:      Height:       General exam: Alert, awake, oriented x 3; 2 L nasal cannula in place.  Mild difficulty speaking in full sentences appreciated. Respiratory system: Expiratory wheezing bilaterally; no using accessory muscle.  Decreased  breath sounds at the bases. Cardiovascular system:RRR. No murmurs, rubs, gallops. Gastrointestinal system: Abdomen is nondistended, soft and nontender. No organomegaly or masses felt. Normal bowel sounds  heard. Central nervous system:  No focal neurological deficits. Extremities: No cyanosis or clubbing. Skin: No petechiae. Psychiatry: Judgement and insight appear normal. Mood & affect appropriate.   Latest data Reviewed: CBC: WBC 3.2, hemoglobin 10.3 and platelet count 1 16K Renal function panel: Sodium 137, potassium 3.0, chloride 102, bicarb 28, BUN 15, creatinine 2.57 and GFR 26  Family Communication: Family at bedside.  Disposition: Status is: Inpatient Remains inpatient appropriate because: Continue further volume management with hemodialysis.  Anticipating discharge back home once medically stable.  Time spent: 35 minutes  Author: Eric Nunnery, MD 06/27/2024 3:54 PM  For on call review [email or web address].

## 2024-06-28 MED ORDER — POTASSIUM CHLORIDE CRYS ER 20 MEQ PO TBCR
40.0000 meq | EXTENDED_RELEASE_TABLET | Freq: Once | ORAL | Status: AC
  Administered 2024-06-28: 40 meq via ORAL
  Filled 2024-06-28: qty 2

## 2024-06-28 MED ORDER — DM-GUAIFENESIN ER 30-600 MG PO TB12: 1.0000 | ORAL_TABLET | Freq: Two times a day (BID) | ORAL | 0 refills | Status: AC

## 2024-06-28 NOTE — Discharge Summary (Signed)
 Physician Discharge Summary   Patient: Melvin Carney MRN: [id number] DOB: 1951/03/18  Admit date:     06/25/2024  Discharge date: 06/28/24  Discharge Physician: Melvin Carney   PCP: Melvin Yancey DELENA, MD   Recommendations at discharge:  Follow-up with PCP in 1 week Follow-up with nephrologist in 1 week, continue scheduled hemodialysis  Discharge Diagnoses: Principal Problem:   Acute on chronic combined systolic and diastolic CHF (congestive heart failure) (HCC) Active Problems:   ESRD (end stage renal disease) (HCC)   Essential hypertension   Melvin Carney  is a 73 y.o. 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 - Patient was sent by his HD center for shortness of breath, patient reports he is compliant with his dialysis, fluid restriction and medication, he has been having some loss of breath over the last couple of days, he finished his entire HD session today without improvement of his dyspnea, so he was sent to ED for further evaluation, denies chest pain, but reports chest tightness, related to dyspnea, denies any dizziness, fever or chills. - In ED chest x-ray significant for volume overload, with bilateral pleural effusion, and P significantly elevated at 3351, potassium 3.3, creatinine 1.57, EKG nonacute, ED discussed with nephrology on-call who reported patient can stay at [facility] and extra HD can be arranged, [facility] hospitalist consulted to admit for volume overload.   Patient responded well to hemodialysis, currently improved shortness of breath, back to baseline on 3 L of oxygen, satting 97%  1-chronic respiratory failure with hypoxia -Resolved, improved shortness of breath, currently on 3 L of oxygen, satting 97% Responding well to hemodialysis - Continue volume management with dialysis    2-acute on chronic combined diastolic and systolic heart  failure - Low-sodium diet discussed with patient - Fluid management with hemodialysis   3-ESRD - On TTS schedule - Discussed with nephrology service, plan is for back-to-back dialysis, he had tx on 9/26 and will have HD again 06/27/2024 in order to get patient back on his usual dialysis schedule and further stabilize volume. - Discharge on 06/28/2024.   4- history of COPD with mild exacerbation - Some wheezing appreciated on today's examination - Patient mild difficulty speaking in full sentences - Will provide short course of steroids and to start treatment with Pulmicort - Continue home bronchodilator management.   5-history of tobacco abuse - Cessation counseling provided - Patient declined nicotine patch and expressed that he had cut down to just 1 cigarette/day.   6-hypertension - Continue home antihypertensive agents. - Follow vital signs.   7-hyperlipidemia - Patient reports no longer taking statins.   8-history of coronary artery disease - Continue beta-blockers and aspirin - Continue outpatient follow-up with cardiology service - Telemetry not demonstrating acute ischemic changes.   9-hypokalemia - Potassium down to 3.0 - Low dose provided for repletion - Further management per dialysis.  Consultants: Nephrologist Procedures performed: Urinalysis Disposition: Home Diet recommendation:  Discharge Diet Orders (From admission, onward)     Start     Ordered   06/28/24 0000  Diet - low sodium heart healthy        06/28/24 1335           Renal diet DISCHARGE MEDICATION: Allergies as of 06/28/2024   No Known Allergies      Medication List     TAKE these medications    albuterol  108 (90 Base) MCG/ACT inhaler Commonly  known as: VENTOLIN  HFA Inhale 2 puffs into the lungs every 6 (six) hours as needed for wheezing or shortness of breath.   amLODipine  10 MG tablet Commonly known as: NORVASC  Take 10 mg by mouth daily.   carvedilol  25 MG tablet Commonly  known as: COREG  Take 25 mg by mouth daily.   dextromethorphan-guaiFENesin 30-600 MG 12hr tablet Commonly known as: MUCINEX DM Take 1 tablet by mouth 2 (two) times daily for 5 days.   fluticasone 50 MCG/ACT nasal spray Commonly known as: FLONASE Place 2 sprays into both nostrils daily.   isosorbide  mononitrate 60 MG 24 hr tablet Commonly known as: IMDUR  Take 60 mg by mouth daily.        Discharge Exam: Filed Weights   06/27/24 1703 06/27/24 2018 06/28/24 0528  Weight: 72 kg 70.5 kg 74.2 kg        General:  AAO x 3,  cooperative, no distress;   HEENT:  Normocephalic, PERRL, otherwise with in Normal limits   Neuro:  CNII-XII intact. , normal motor and sensation, reflexes intact   Lungs:   Clear to auscultation BL, Respirations unlabored,  No wheezes / crackles  Cardio:    S1/S2, RRR, No murmure, No Rubs or Gallops   Abdomen:  Soft, non-tender, bowel sounds active all four quadrants, no guarding or peritoneal signs.  Muscular  skeletal:  Limited exam -global generalized weaknesses - in bed, able to move all 4 extremities,   2+ pulses,  symmetric, No pitting edema  Skin:  Dry, warm to touch, negative for any Rashes,  Wounds: Please see nursing documentation          Condition at discharge: good  The results of significant diagnostics from this hospitalization (including imaging, microbiology, ancillary and laboratory) are listed below for reference.   Imaging Studies: DG Chest 2 View Result Date: 06/25/2024 EXAM: 2 VIEW(S) XRAY OF THE CHEST 06/25/2024 12:15:00 PM COMPARISON: None available. CLINICAL HISTORY: SOB. Table formatting from the original note was not included.; Images from the original note were not included.; Per triage; Pt arrived via POV following dialysis today. Pt has LUE Restriction. Pt reports SOB and chest pain X 2 days. FINDINGS: LINES, TUBES AND DEVICES: Right hemodialysis catheter in place with tip at cavoatrial junction. LUNGS AND PLEURA: Mild  pulmonary edema. Moderate bilateral pleural effusions with adjacent compressive atelectasis and/or airspace disease. No pneumothorax. HEART AND MEDIASTINUM: Cardiomegaly. BONES AND SOFT TISSUES: Thoracic spondylosis. IMPRESSION: 1. Mild pulmonary edema with moderate bilateral pleural effusions and adjacent compressive atelectasis and/or airspace disease. No pneumothorax. 2. Cardiomegaly. 3. Right hemodialysis catheter in place with tip at the cavoatrial junction. Electronically signed by: Waddell Calk MD 06/25/2024 12:37 PM EDT RP Workstation: [id number]    Microbiology: No results found for this or any previous visit.  Labs: CBC: Recent Labs  Lab 06/25/24 1226 06/26/24 0459  WBC 3.3* 3.2*  NEUTROABS 1.7  --   HGB 12.7* 10.3*  HCT 39.0 32.3*  MCV 91.1 90.5  PLT 129* 116*   Basic Metabolic Panel: Recent Labs  Lab 06/25/24 1226 06/26/24 0459  NA 137 137  K 3.3* 3.0*  CL 97* 102  CO2 29 28  GLUCOSE 99 131*  BUN 7* 15  CREATININE 1.57* 2.57*  CALCIUM 7.9* 7.6*  PHOS  --  2.7   Liver Function Tests: Recent Labs  Lab 06/25/24 1226 06/26/24 0459  AST 19  --   ALT 12  --   ALKPHOS 87  --   BILITOT 0.6  --  PROT 5.9*  --   ALBUMIN 2.6* 2.2*   CBG: No results for input(s): GLUCAP in the last 168 hours.  Discharge time spent: greater than 30 minutes.  Signed: Adriana Carney Grams, MD [facility] 06/28/2024

## 2024-06-28 NOTE — Plan of Care (Signed)
   Problem: Education: Goal: Knowledge of General Education information will improve Description Including pain rating scale, medication(s)/side effects and non-pharmacologic comfort measures Outcome: Progressing   Problem: Health Behavior/Discharge Planning: Goal: Ability to manage health-related needs will improve Outcome: Progressing

## 2024-06-29 NOTE — Progress Notes (Signed)
 Late note entry 9/29. 0959am   D/c over weekend noted. Contacted out-pt HD clinic, [facility], to inform of pt d/c and anticipated arrival tomorrow. Last note and d/c summ faxed over at this time. No further support needed.   Quantarius Genrich Dialysis Navigator [phone]

## 2024-06-30 ENCOUNTER — Encounter

## 2024-07-23 ENCOUNTER — Ambulatory Visit (HOSPITAL_COMMUNITY)
Admission: RE | Admit: 2024-07-23 | Discharge: 2024-07-23 | Disposition: A | Discharge status: Home / Self Care | Source: Ambulatory Visit | Attending: Surgery | Admitting: Surgery

## 2024-07-23 ENCOUNTER — Ambulatory Visit: Attending: Vascular Surgery | Admitting: Physician Assistant

## 2024-07-23 ENCOUNTER — Other Ambulatory Visit: Payer: Self-pay

## 2024-07-23 VITALS — BP 143/83 | HR 91 | Temp 97.9°F | Wt 160.9 lb

## 2024-07-23 DIAGNOSIS — N186 End stage renal disease: Secondary | ICD-10-CM | POA: Insufficient documentation

## 2024-07-23 DIAGNOSIS — Z992 Dependence on renal dialysis: Secondary | ICD-10-CM

## 2024-07-23 NOTE — Progress Notes (Signed)
 Office Note   History of Present Illness   Melvin Carney is a 73 y.o. (06-Jul-1951) male who presents for a postop visit.  He recently underwent creation of a left brachiobasilic fistula on 04/15/2024 by Dr. Serene.  This was done for permanent dialysis access.  He returns today for follow-up.  He is doing okay at today's office visit.  He denies any issues with his left arm incision such as drainage, swelling, or dehiscence.  He denies any left hand weakness, pain, or excessive coldness.  He does endorse some numbness in his left pinky since surgery, however this is not bothersome.  He dialyzes on Tuesdays, Thursdays, and Saturdays via [facility].  Current Outpatient Medications  Medication Sig Dispense Refill   albuterol  (VENTOLIN  HFA) 108 (90 Base) MCG/ACT inhaler Inhale 2 puffs into the lungs every 6 (six) hours as needed for wheezing or shortness of breath.     amLODipine  (NORVASC ) 10 MG tablet Take 10 mg by mouth daily.     carvedilol  (COREG ) 25 MG tablet Take 25 mg by mouth daily.     fluticasone (FLONASE) 50 MCG/ACT nasal spray Place 2 sprays into both nostrils daily.     isosorbide  mononitrate (IMDUR ) 60 MG 24 hr tablet Take 60 mg by mouth daily.     No current facility-administered medications for this visit.    REVIEW OF SYSTEMS (negative unless checked):   Cardiac:  []  Chest pain or chest pressure? []  Shortness of breath upon activity? []  Shortness of breath when lying flat? []  Irregular heart rhythm?  Vascular:  []  Pain in calf, thigh, or hip brought on by walking? []  Pain in feet at night that wakes you up from your sleep? []  Blood clot in your veins? []  Leg swelling?  Pulmonary:  [x]  Oxygen at home? []  Productive cough? []  Wheezing?  Neurologic:  []  Sudden weakness in arms or legs? []  Sudden numbness in arms or legs? []  Sudden onset of difficult speaking or slurred speech? []  Temporary loss of vision in one eye? []  Problems with  dizziness?  Gastrointestinal:  []  Blood in stool? []  Vomited blood?  Genitourinary:  []  Burning when urinating? []  Blood in urine?  Psychiatric:  []  Major depression  Hematologic:  []  Bleeding problems? []  Problems with blood clotting?  Dermatologic:  []  Rashes or ulcers?  Constitutional:  []  Fever or chills?  Ear/Nose/Throat:  []  Change in hearing? []  Nose bleeds? []  Sore throat?  Musculoskeletal:  []  Back pain? []  Joint pain? []  Muscle pain?   Physical Examination   Vitals:   07/23/24 0918  BP: (!) 143/83  Pulse: 91  Temp: 97.9 F (36.6 C)  TempSrc: Temporal  Weight: 160 lb 14.4 oz (73 kg)   Body mass index is 24.46 kg/m.  General:  WDWN in NAD; vital signs documented above Gait: Not observed HENT: WNL, normocephalic Pulmonary: normal non-labored breathing, on supplemental oxygen 3L Cardiac: Regular Abdomen: soft, NT, no masses Skin: without rashes Vascular Exam/Pulses: Palpable left radial pulse Extremities: Well-healed left AC fossa incision.  Left brachiobasilic fistula with great thrill Musculoskeletal: no muscle wasting or atrophy  Neurologic: A&O X 3; grip strength in left hand 5/5 Psychiatric:  The pt has Normal affect.   Non-invasive Vascular Imaging   Left Arm Access Duplex  (07/23/2024):  +--------------------+----------+-----------------+--------+  AVF                PSV (cm/s)Flow Vol (mL/min)Comments  +--------------------+----------+-----------------+--------+  Native artery inflow   243  1222                 +--------------------+----------+-----------------+--------+  AVF Anastomosis        738                               +--------------------+----------+-----------------+--------+     +------------+----------+-------------+----------+-----------------------+  OUTFLOW VEINPSV (cm/s)Diameter (cm)Depth (cm)       Describe           +------------+----------+-------------+----------+-----------------------+  Shoulder       59        1.29        0.44                            +------------+----------+-------------+----------+-----------------------+  Prox UA        543        1.18        0.75   possible retained valve  +------------+----------+-------------+----------+-----------------------+  Mid UA         150        0.80        0.53                            +------------+----------+-------------+----------+-----------------------+  Dist UA        312        0.81        0.56      competing branch      +------------+----------+-------------+----------+-----------------------+  AC Fossa       593        0.53        0.51                            +------------+----------+-------------+----------+-----------------------+     Medical Decision Making   Melvin Carney is a 73 y.o. male who presents for a postop visit  The patient recently underwent creation of a left brachiobasilic fistula for permanent dialysis access.  His left arm incision is well-healed without signs of infection or fluid collection Duplex demonstrates a well matured fistula with good size and flow volume of 1222 mL/min He does endorse some left pinky numbness since surgery, however this is tolerable.  He denies any left hand pain, weakness, or coldness.  He does have a palpable left radial pulse On exam his left brachiobasilic fistula has a great thrill and is minimally pulsatile He can be scheduled for his left brachiobasilic second stage surgery with Dr.Brabham in the next couple of weeks on a non-dialysis day. He is agreeable to surgery   Ahmed Holster PA-C [facility] Office: [phone]  Clinic MD: [place]

## 2024-07-23 NOTE — H&P (View-Only) (Signed)
 Office Note   History of Present Illness   Melvin Carney is a 73 y.o. (06-Jul-1951) male who presents for a postop visit.  He recently underwent creation of a left brachiobasilic fistula on 04/15/2024 by Dr. Serene.  This was done for permanent dialysis access.  He returns today for follow-up.  He is doing okay at today's office visit.  He denies any issues with his left arm incision such as drainage, swelling, or dehiscence.  He denies any left hand weakness, pain, or excessive coldness.  He does endorse some numbness in his left pinky since surgery, however this is not bothersome.  He dialyzes on Tuesdays, Thursdays, and Saturdays via [facility].  Current Outpatient Medications  Medication Sig Dispense Refill   albuterol  (VENTOLIN  HFA) 108 (90 Base) MCG/ACT inhaler Inhale 2 puffs into the lungs every 6 (six) hours as needed for wheezing or shortness of breath.     amLODipine  (NORVASC ) 10 MG tablet Take 10 mg by mouth daily.     carvedilol  (COREG ) 25 MG tablet Take 25 mg by mouth daily.     fluticasone (FLONASE) 50 MCG/ACT nasal spray Place 2 sprays into both nostrils daily.     isosorbide  mononitrate (IMDUR ) 60 MG 24 hr tablet Take 60 mg by mouth daily.     No current facility-administered medications for this visit.    REVIEW OF SYSTEMS (negative unless checked):   Cardiac:  []  Chest pain or chest pressure? []  Shortness of breath upon activity? []  Shortness of breath when lying flat? []  Irregular heart rhythm?  Vascular:  []  Pain in calf, thigh, or hip brought on by walking? []  Pain in feet at night that wakes you up from your sleep? []  Blood clot in your veins? []  Leg swelling?  Pulmonary:  [x]  Oxygen at home? []  Productive cough? []  Wheezing?  Neurologic:  []  Sudden weakness in arms or legs? []  Sudden numbness in arms or legs? []  Sudden onset of difficult speaking or slurred speech? []  Temporary loss of vision in one eye? []  Problems with  dizziness?  Gastrointestinal:  []  Blood in stool? []  Vomited blood?  Genitourinary:  []  Burning when urinating? []  Blood in urine?  Psychiatric:  []  Major depression  Hematologic:  []  Bleeding problems? []  Problems with blood clotting?  Dermatologic:  []  Rashes or ulcers?  Constitutional:  []  Fever or chills?  Ear/Nose/Throat:  []  Change in hearing? []  Nose bleeds? []  Sore throat?  Musculoskeletal:  []  Back pain? []  Joint pain? []  Muscle pain?   Physical Examination   Vitals:   07/23/24 0918  BP: (!) 143/83  Pulse: 91  Temp: 97.9 F (36.6 C)  TempSrc: Temporal  Weight: 160 lb 14.4 oz (73 kg)   Body mass index is 24.46 kg/m.  General:  WDWN in NAD; vital signs documented above Gait: Not observed HENT: WNL, normocephalic Pulmonary: normal non-labored breathing, on supplemental oxygen 3L Cardiac: Regular Abdomen: soft, NT, no masses Skin: without rashes Vascular Exam/Pulses: Palpable left radial pulse Extremities: Well-healed left AC fossa incision.  Left brachiobasilic fistula with great thrill Musculoskeletal: no muscle wasting or atrophy  Neurologic: A&O X 3; grip strength in left hand 5/5 Psychiatric:  The pt has Normal affect.   Non-invasive Vascular Imaging   Left Arm Access Duplex  (07/23/2024):  +--------------------+----------+-----------------+--------+  AVF                PSV (cm/s)Flow Vol (mL/min)Comments  +--------------------+----------+-----------------+--------+  Native artery inflow   243  1222                 +--------------------+----------+-----------------+--------+  AVF Anastomosis        738                               +--------------------+----------+-----------------+--------+     +------------+----------+-------------+----------+-----------------------+  OUTFLOW VEINPSV (cm/s)Diameter (cm)Depth (cm)       Describe           +------------+----------+-------------+----------+-----------------------+  Shoulder       59        1.29        0.44                            +------------+----------+-------------+----------+-----------------------+  Prox UA        543        1.18        0.75   possible retained valve  +------------+----------+-------------+----------+-----------------------+  Mid UA         150        0.80        0.53                            +------------+----------+-------------+----------+-----------------------+  Dist UA        312        0.81        0.56      competing branch      +------------+----------+-------------+----------+-----------------------+  AC Fossa       593        0.53        0.51                            +------------+----------+-------------+----------+-----------------------+     Medical Decision Making   Melvin Carney is a 73 y.o. male who presents for a postop visit  The patient recently underwent creation of a left brachiobasilic fistula for permanent dialysis access.  His left arm incision is well-healed without signs of infection or fluid collection Duplex demonstrates a well matured fistula with good size and flow volume of 1222 mL/min He does endorse some left pinky numbness since surgery, however this is tolerable.  He denies any left hand pain, weakness, or coldness.  He does have a palpable left radial pulse On exam his left brachiobasilic fistula has a great thrill and is minimally pulsatile He can be scheduled for his left brachiobasilic second stage surgery with Dr.Brabham in the next couple of weeks on a non-dialysis day. He is agreeable to surgery   Ahmed Holster PA-C [facility] Office: [phone]  Clinic MD: [place]

## 2024-07-27 ENCOUNTER — Telehealth: Payer: Self-pay

## 2024-07-27 NOTE — Telephone Encounter (Signed)
 LVM for patient in re: to surgery date and time reschedule.  Patient letter also mailed to home address.

## 2024-08-06 ENCOUNTER — Other Ambulatory Visit: Payer: Self-pay

## 2024-08-06 ENCOUNTER — Encounter (HOSPITAL_COMMUNITY): Payer: Self-pay | Admitting: Surgery

## 2024-08-06 NOTE — Anesthesia Preprocedure Evaluation (Signed)
 Anesthesia Evaluation  Patient identified by MRN, date of birth, ID band Patient awake    Reviewed: Allergy & Precautions, NPO status , Patient's Chart, lab work & pertinent test results, reviewed documented beta blocker date and time   Airway Mallampati: III  TM Distance: >3 FB Neck ROM: Full    Dental  (+) Dental Advisory Given, Edentulous Lower, Edentulous Upper   Pulmonary shortness of breath, COPD,  COPD inhaler and oxygen dependent, Patient abstained from smoking., former smoker   Pulmonary exam normal breath sounds clear to auscultation       Cardiovascular hypertension, Pt. on medications and Pt. on home beta blockers (-) angina +CHF  (-) Past MI Normal cardiovascular exam Rhythm:Regular Rate:Normal     Neuro/Psych  Headaches    GI/Hepatic negative GI ROS, Neg liver ROS,,,  Endo/Other  negative endocrine ROS    Renal/GU Renal disease (TTHSAT; K+ 3.5)     Musculoskeletal  (+) Arthritis ,    Abdominal   Peds  Hematology negative hematology ROS (+)   Anesthesia Other Findings Day of surgery medications reviewed with the patient.  Reproductive/Obstetrics                              Anesthesia Physical Anesthesia Plan  ASA: 4  Anesthesia Plan: General   Post-op Pain Management: Tylenol  PO (pre-op )*   Induction: Intravenous  PONV Risk Score and Plan: 2 and Dexamethasone and Ondansetron   Airway Management Planned: LMA  Additional Equipment:   Intra-op Plan:   Post-operative Plan: Extubation in OR  Informed Consent: I have reviewed the patients History and Physical, chart, labs and discussed the procedure including the risks, benefits and alternatives for the proposed anesthesia with the patient or authorized representative who has indicated his/her understanding and acceptance.     Dental advisory given  Plan Discussed with: CRNA  Anesthesia Plan Comments: (PAT note  written 08/06/2024 by Taheerah Guldin, PA-C.  )         Anesthesia Quick Evaluation

## 2024-08-06 NOTE — Progress Notes (Signed)
 SDW call  Patient was given pre-op  instructions over the phone. Patient verbalized understanding of instructions provided.     PCP - Dr. Yancey [place] Nephrologist: [facility], dialysis T,Th, S Cardiologist -  Pulmonary:    PPM/ICD - denies Device Orders - na Rep Notified - na   Chest x-ray - 06/25/2024 EKG -  06/26/2024 Stress Test - ECHO -  Cardiac Cath -   Sleep Study/sleep apnea/CPAP: denies. Patient is on home oxygen 3L/[place].  Non-diabetic  Blood Thinner Instructions: denies Aspirin Instructions:denies   ERAS Protcol - NPO   Anesthesia review: Yes. COPD with home oxygen, HTN, ESRD, CHF   Patient states always short of breath and uses oxygen 24/7. He denies fever, cough and chest pain over the phone call  Your procedure is scheduled on Friday August 07, 2024  Report to [facility] Main Entrance A at  1015  A.M., then check in with the Admitting office.  Call this number if you have problems the morning of surgery:  [phone]   If you have any questions prior to your surgery date call [phone]: Open Monday-Friday 8am-4pm If you experience any cold or flu symptoms such as cough, fever, chills, shortness of breath, etc. between now and your scheduled surgery, please notify us  at the above number    Remember:  Do not eat or drink after midnight the night before your surgery  Take these medicines the morning of surgery with A SIP OF WATER:  Amlodipine , carvedilol , flonase, imdur     As needed: Albuterol    As of today, STOP taking any Aspirin (unless otherwise instructed by your surgeon) Aleve, Naproxen, Ibuprofen, Motrin, Advil, Goody's, BC's, all herbal medications, fish oil, and all vitamins.

## 2024-08-06 NOTE — Progress Notes (Signed)
 Anesthesia Chart Review: SAME DAY WORK-UP  Case: [id number] Date/Time: 08/07/24 1240   Procedure: TRANSPOSITION, VEIN, BASILIC (Left)   Anesthesia type: Choice   Diagnosis: ESRD (end stage renal disease) (HCC) [N18.6]   Pre-op  diagnosis: ESRD   [facility] ROOM 11 / [facility]   Surgeons: Serene Gaile ORN, MD       DISCUSSION: Patient is a 73 year old male scheduled for the above procedure. S/p first stage left brachiobasilic AVF creation 04/15/2024. He started HD in April 2025. He is currently dialyzing via a PermCath TDC and needs permanent HD access.   History includes former smoker (quit 2024), HLD, HTN, COPD (3L/[place] home O2), dyspnea, ESRD (ANCA-associated glomerulonephritis), CHF.   - 06/25/2024 - 9 /28/2025: [place] admission for acute on chronic diastolic CHF and mild COPD exacerbation. BNP elevated and CXR showed vascular congestion. He denied chest pain. Nephrology consulted and he underwent additional hemodialysis (06/26/2024 & 06/27/2024) for volume excess. His COPD was treated with short course of steroids and started on Pulmicort. Already on home O2 2-3L. Smoking cessation encouraged--he declined nicotine patch but reported cutting down to 1 cigarette/day.  - 04/15/2024:  S/p first stage left brachiobasilic AVF creation 04/15/2024 at [facility]. - 01/12/2024 - 01/22/2024: Presented to [facility] ED for increased LE edema and SOB. Transferred to [facility] and admitted for anasarca. BNP > 30,000, Creatinine 5.5, K 6.2. CXR with pulmonary edema and bilateral pleural effusion. He was treated with calcium gluconate, Kayexalate, insulin, Lasix  at [facility] and was given Rocephin for UTI. At [facility], Nephrology consulted. He given another dose of Rituxan and prednisone tapered to 40 mg daily. He PermCath was placed on 01/16/2024 and started on hemodialysis with plans for out-patient HD TTS. Limited TTE showed stable LVEF 40-45%. He also received 3 units total PRBC. - 12/26/2023 - 01/07/2024: Presented to [facility] ED on 12/25/2023 for [facility] admission for ongoing dyspnea and cough, abdominal distention. He was transferred to [facility] after he was found to be in acute renal failure (Cr 5.8) and anemia (HGB 7.7). He received PRBC. TTE showed LVEF 45-50%, mild LV hypokinesis, trace MR, mild PR, trivial pericardial effusion. Nephrology consulted. Creatinine peaked at 7.5. 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 Out-patient follow-up with nephrology. - 3/17/[facility] ED visit for left CAP.  He reported chronic, but improved, dyspnea since September. He was prescribed Pulmicort during that admission, but it not on his medication list. He reported use of albuterol  MDI daily. He is a same day work-up, so anesthesia team to evaluate on the day of surgery. His last HD should be on 08/06/2024. He will need labs on arrival for surgery.   VS:  Wt Readings from Last 3 Encounters:  07/23/24 73 kg  06/28/24 74.2 kg  04/15/24 68.6 kg   BP Readings from Last 3 Encounters:  07/23/24 (!) 143/83  06/28/24 139/80  04/15/24 115/74   Pulse Readings from Last 3 Encounters:  07/23/24 91  06/28/24 85  04/15/24 63     PROVIDERS: Orpha Yancey LABOR, MD is PCP    LABS: For day of surgery. Last results in [facility] include: Lab Results  Component Value Date   WBC 3.2 (L) 06/26/2024   HGB 10.3 (L) 06/26/2024   HCT 32.3 (L) 06/26/2024   PLT 116 (L) 06/26/2024   GLUCOSE 131 (H) 06/26/2024   ALT 12 06/25/2024  AST 19 06/25/2024   NA 137 06/26/2024   K 3.0 (L) 06/26/2024   CL 102 06/26/2024   CREATININE 2.57 (H) 06/26/2024   BUN 15 06/26/2024   CO2 28 06/26/2024     IMAGES: CXR 06/25/2024: IMPRESSION: 1. Mild pulmonary edema with moderate bilateral pleural effusions and adjacent compressive atelectasis and/or airspace disease. No  pneumothorax. 2. Cardiomegaly. 3. Right hemodialysis catheter in place with tip at the cavoatrial junction.   EKG: 06/25/2024: Sinus rhythm with Premature atrial complexes Nonspecific T wave abnormality Abnormal ECG When compared with ECG of 11-Jan-2024 14:08, Sinus rhythm has replaced Junctional rhythm QT has lengthened (QT/QTcB 392/463 ms) Confirmed by Patsey Lot [phone]) on 06/25/2024 12:56:57 PM   CV: Echo (Limited) 01/21/2024 ([facility]): Left ventricle systolic function is mildly reduced  LVEF 45-50%  Mild hypokinesis of left ventricle   Echo (Complete) 12/26/2023 ([facility]): Left Ventricle: Systolic function is mildly abnormal. EF: 45-50%.    Left Ventricle: There is mild  hypokinesis of the left ventricle.    Pulmonic Valve: Mild regurgitation.  Trace mitral valve regurgitation. Mild pulmonic valve regurgitation.  US  Abd Aorta 03/14/2023: IMPRESSION: No aneurysm identified.   Past Medical History:  Diagnosis Date   Arthritis    Chronic kidney disease    COPD (chronic obstructive pulmonary disease) (HCC)    Dyspnea    Headache    Hypercholesterolemia    Hyperlipidemia    Hypertension    PNA (pneumonia)     Past Surgical History:  Procedure Laterality Date   BASCILIC VEIN TRANSPOSITION Left 04/15/2024   Procedure: TRANSPOSITION, VEIN, BASILIC;  Surgeon: Serene Gaile ORN, MD;  Location: [facility] OR;  Service: Vascular;  Laterality: Left;   COLONOSCOPY WITH PROPOFOL  N/A 03/17/2018   Procedure: COLONOSCOPY WITH PROPOFOL ;  Surgeon: Viktoria Lamar DASEN, MD;  Location: [facility] ENDOSCOPY;  Service: Endoscopy;  Laterality: N/A;   Right Knee Ligament Repair      MEDICATIONS: No current facility-administered medications for this encounter.    albuterol  (VENTOLIN  HFA) 108 (90 Base) MCG/ACT inhaler   amLODipine  (NORVASC ) 10 MG tablet   carvedilol  (COREG ) 25 MG tablet   fluticasone (FLONASE) 50 MCG/ACT nasal spray   isosorbide  mononitrate (IMDUR ) 60 MG 24 hr tablet     Isaiah Ruder, PA-C Surgical Short Stay/Anesthesiology [facility] Phone [phone] [facility] Phone [phone] 08/06/2024 10:23 AM

## 2024-08-07 ENCOUNTER — Encounter (HOSPITAL_COMMUNITY): Admitting: Vascular Surgery

## 2024-08-07 ENCOUNTER — Ambulatory Visit (HOSPITAL_BASED_OUTPATIENT_CLINIC_OR_DEPARTMENT_OTHER): Admitting: Vascular Surgery

## 2024-08-07 ENCOUNTER — Encounter (HOSPITAL_COMMUNITY)
Admission: RE | Disposition: A | Discharge status: Home / Self Care | Payer: Self-pay | Source: Home / Self Care | Attending: Surgery

## 2024-08-07 ENCOUNTER — Ambulatory Visit (HOSPITAL_COMMUNITY)
Admission: RE | Admit: 2024-08-07 | Discharge: 2024-08-07 | Disposition: A | Discharge status: Home / Self Care | Attending: Surgery | Admitting: Surgery

## 2024-08-07 DIAGNOSIS — Z87891 Personal history of nicotine dependence: Secondary | ICD-10-CM | POA: Insufficient documentation

## 2024-08-07 DIAGNOSIS — I132 Hypertensive heart and chronic kidney disease with heart failure and with stage 5 chronic kidney disease, or end stage renal disease: Secondary | ICD-10-CM

## 2024-08-07 DIAGNOSIS — I5032 Chronic diastolic (congestive) heart failure: Secondary | ICD-10-CM | POA: Insufficient documentation

## 2024-08-07 DIAGNOSIS — N186 End stage renal disease: Secondary | ICD-10-CM

## 2024-08-07 DIAGNOSIS — J449 Chronic obstructive pulmonary disease, unspecified: Secondary | ICD-10-CM | POA: Diagnosis not present

## 2024-08-07 DIAGNOSIS — Z992 Dependence on renal dialysis: Secondary | ICD-10-CM | POA: Diagnosis not present

## 2024-08-07 DIAGNOSIS — Z7951 Long term (current) use of inhaled steroids: Secondary | ICD-10-CM | POA: Insufficient documentation

## 2024-08-07 DIAGNOSIS — I5043 Acute on chronic combined systolic (congestive) and diastolic (congestive) heart failure: Secondary | ICD-10-CM

## 2024-08-07 DIAGNOSIS — Z9981 Dependence on supplemental oxygen: Secondary | ICD-10-CM | POA: Diagnosis not present

## 2024-08-07 HISTORY — PX: BASCILIC VEIN TRANSPOSITION: SHX5742

## 2024-08-07 LAB — POCT I-STAT, CHEM 8
BUN: 31 mg/dL — ABNORMAL HIGH (ref 8–23)
BUN: 46 mg/dL — ABNORMAL HIGH (ref 8–23)
Calcium, Ion: 1 mmol/L — ABNORMAL LOW (ref 1.15–1.40)
Calcium, Ion: 1.09 mmol/L — ABNORMAL LOW (ref 1.15–1.40)
Chloride: 98 mmol/L (ref 98–111)
Chloride: 98 mmol/L (ref 98–111)
Creatinine, Ser: 2.6 mg/dL — ABNORMAL HIGH (ref 0.61–1.24)
Creatinine, Ser: 2.7 mg/dL — ABNORMAL HIGH (ref 0.61–1.24)
Glucose, Bld: 90 mg/dL (ref 70–99)
Glucose, Bld: 90 mg/dL (ref 70–99)
HCT: 33 % — ABNORMAL LOW (ref 39.0–52.0)
HCT: 34 % — ABNORMAL LOW (ref 39.0–52.0)
Hemoglobin: 11.2 g/dL — ABNORMAL LOW (ref 13.0–17.0)
Hemoglobin: 11.6 g/dL — ABNORMAL LOW (ref 13.0–17.0)
Potassium: 3.5 mmol/L (ref 3.5–5.1)
Potassium: 6.6 mmol/L (ref 3.5–5.1)
Sodium: 135 mmol/L (ref 135–145)
Sodium: 138 mmol/L (ref 135–145)
TCO2: 28 mmol/L (ref 22–32)
TCO2: 31 mmol/L (ref 22–32)

## 2024-08-07 SURGERY — TRANSPOSITION, VEIN, BASILIC
Anesthesia: General | Laterality: Left

## 2024-08-07 MED ORDER — IPRATROPIUM-ALBUTEROL 0.5-2.5 (3) MG/3ML IN SOLN
3.0000 mL | Freq: Once | RESPIRATORY_TRACT | Status: AC
  Administered 2024-08-07: 3 mL via RESPIRATORY_TRACT

## 2024-08-07 MED ORDER — ONDANSETRON HCL 4 MG/2ML IJ SOLN: 4.0000 mg | Freq: Once | INTRAMUSCULAR | Status: DC | PRN

## 2024-08-07 MED ORDER — IPRATROPIUM-ALBUTEROL 0.5-2.5 (3) MG/3ML IN SOLN
RESPIRATORY_TRACT | Status: AC
  Filled 2024-08-07: qty 3

## 2024-08-07 MED ORDER — SODIUM CHLORIDE FLUSH 0.9 % IV SOLN
INTRAVENOUS | Status: DC | PRN
  Administered 2024-08-07: 95 mL

## 2024-08-07 MED ORDER — HEPARIN 6000 UNIT IRRIGATION SOLUTION
Status: AC
Start: 2024-08-07 — End: 2024-08-07
  Filled 2024-08-07: qty 500

## 2024-08-07 MED ORDER — CHLORHEXIDINE GLUCONATE 0.12 % MT SOLN: 15.0000 mL | Freq: Once | OROMUCOSAL | Status: AC

## 2024-08-07 MED ORDER — SURGIFLO WITH THROMBIN (HEMOSTATIC MATRIX KIT) OPTIME
TOPICAL | Status: DC | PRN
  Administered 2024-08-07: 1 via TOPICAL

## 2024-08-07 MED ORDER — CHLORHEXIDINE GLUCONATE 4 % EX SOLN: 60.0000 mL | Freq: Once | CUTANEOUS | Status: DC

## 2024-08-07 MED ORDER — BUPIVACAINE HCL (PF) 0.5 % IJ SOLN
INTRAMUSCULAR | Status: AC
Start: 2024-08-07 — End: 2024-08-07
  Filled 2024-08-07: qty 30

## 2024-08-07 MED ORDER — 0.9 % SODIUM CHLORIDE (POUR BTL) OPTIME
TOPICAL | Status: DC | PRN
  Administered 2024-08-07: 1000 mL

## 2024-08-07 MED ORDER — FENTANYL CITRATE (PF) 250 MCG/5ML IJ SOLN
INTRAMUSCULAR | Status: DC | PRN
  Administered 2024-08-07 (×4): 25 ug via INTRAVENOUS

## 2024-08-07 MED ORDER — BUPIVACAINE LIPOSOME 1.3 % IJ SUSP
INTRAMUSCULAR | Status: AC
Start: 2024-08-07 — End: 2024-08-07
  Filled 2024-08-07: qty 20

## 2024-08-07 MED ORDER — FENTANYL CITRATE (PF) 100 MCG/2ML IJ SOLN
INTRAMUSCULAR | Status: AC
  Filled 2024-08-07: qty 2

## 2024-08-07 MED ORDER — PROPOFOL 10 MG/ML IV BOLUS
INTRAVENOUS | Status: DC | PRN
  Administered 2024-08-07: 100 mg via INTRAVENOUS

## 2024-08-07 MED ORDER — EPHEDRINE SULFATE-NACL 50-0.9 MG/10ML-% IV SOSY
PREFILLED_SYRINGE | INTRAVENOUS | Status: DC | PRN
  Administered 2024-08-07 (×2): 5 mg via INTRAVENOUS

## 2024-08-07 MED ORDER — PHENYLEPHRINE HCL-NACL 20-0.9 MG/250ML-% IV SOLN
INTRAVENOUS | Status: DC | PRN
  Administered 2024-08-07: 50 ug/min via INTRAVENOUS
  Administered 2024-08-07: 70 ug/min via INTRAVENOUS

## 2024-08-07 MED ORDER — SODIUM CHLORIDE 0.9 % IV SOLN: INTRAVENOUS | Status: DC

## 2024-08-07 MED ORDER — ORAL CARE MOUTH RINSE
15.0000 mL | Freq: Once | OROMUCOSAL | Status: AC
Start: 2024-08-07 — End: 2024-08-07

## 2024-08-07 MED ORDER — CHLORHEXIDINE GLUCONATE 0.12 % MT SOLN
OROMUCOSAL | Status: AC
  Administered 2024-08-07: 15 mL via OROMUCOSAL
  Filled 2024-08-07: qty 15

## 2024-08-07 MED ORDER — CEFAZOLIN SODIUM-DEXTROSE 2-4 GM/100ML-% IV SOLN
2.0000 g | INTRAVENOUS | Status: AC
  Administered 2024-08-07: 2 g via INTRAVENOUS
  Filled 2024-08-07: qty 100

## 2024-08-07 MED ORDER — FENTANYL CITRATE (PF) 100 MCG/2ML IJ SOLN
25.0000 ug | INTRAMUSCULAR | Status: DC | PRN
  Administered 2024-08-07: 25 ug via INTRAVENOUS

## 2024-08-07 MED ORDER — OXYCODONE-ACETAMINOPHEN 5-325 MG PO TABS: 1.0000 | ORAL_TABLET | Freq: Four times a day (QID) | ORAL | 0 refills | Status: AC | PRN

## 2024-08-07 MED ORDER — ONDANSETRON HCL 4 MG/2ML IJ SOLN
INTRAMUSCULAR | Status: DC | PRN
  Administered 2024-08-07: 4 mg via INTRAVENOUS

## 2024-08-07 MED ORDER — LIDOCAINE 2% (20 MG/ML) 5 ML SYRINGE
INTRAMUSCULAR | Status: DC | PRN
  Administered 2024-08-07: 80 mg via INTRAVENOUS

## 2024-08-07 MED ORDER — PROPOFOL 10 MG/ML IV BOLUS
INTRAVENOUS | Status: AC
  Filled 2024-08-07: qty 20

## 2024-08-07 MED ORDER — SODIUM CHLORIDE (PF) 0.9 % IJ SOLN
INTRAMUSCULAR | Status: AC
  Filled 2024-08-07: qty 50

## 2024-08-07 MED ORDER — HEPARIN 6000 UNIT IRRIGATION SOLUTION
Status: DC | PRN
Start: 2024-08-07 — End: 2024-08-07
  Administered 2024-08-07: 1

## 2024-08-07 MED ORDER — ACETAMINOPHEN 500 MG PO TABS
1000.0000 mg | ORAL_TABLET | Freq: Once | ORAL | Status: AC
  Administered 2024-08-07: 1000 mg via ORAL
  Filled 2024-08-07: qty 2

## 2024-08-07 SURGICAL SUPPLY — 38 items
ARMBAND PINK RESTRICT EXTREMIT (MISCELLANEOUS) ×1 IMPLANT
BAG COUNTER SPONGE SURGICOUNT (BAG) IMPLANT
BNDG ELASTIC 4INX 5YD STR LF (GAUZE/BANDAGES/DRESSINGS) IMPLANT
BNDG GAUZE DERMACEA FLUFF 4 (GAUZE/BANDAGES/DRESSINGS) IMPLANT
CANISTER SUCTION 3000ML PPV (SUCTIONS) ×1 IMPLANT
CLIP TI MEDIUM 24 (CLIP) IMPLANT
CLIP TI MEDIUM 6 (CLIP) IMPLANT
CLIP TI WIDE RED SMALL 24 (CLIP) IMPLANT
CLIP TI WIDE RED SMALL 6 (CLIP) IMPLANT
CNTNR URN SCR LID CUP LEK RST (MISCELLANEOUS) ×1 IMPLANT
COVER PROBE W GEL 5X96 (DRAPES) ×1 IMPLANT
DERMABOND ADVANCED .7 DNX12 (GAUZE/BANDAGES/DRESSINGS) ×1 IMPLANT
ELECTRODE REM PT RTRN 9FT ADLT (ELECTROSURGICAL) ×1 IMPLANT
FELT TEFLON 1X6 (MISCELLANEOUS) IMPLANT
GLOVE BIOGEL PI IND STRL 8 (GLOVE) ×1 IMPLANT
GLOVE SURG SS PI 7.5 STRL IVOR (GLOVE) ×1 IMPLANT
GOWN STRL REUS W/ TWL LRG LVL3 (GOWN DISPOSABLE) ×2 IMPLANT
GOWN STRL REUS W/ TWL XL LVL3 (GOWN DISPOSABLE) ×1 IMPLANT
HEMOSTAT SNOW SURGICEL 2X4 (HEMOSTASIS) IMPLANT
KIT BASIN OR (CUSTOM PROCEDURE TRAY) ×1 IMPLANT
KIT TURNOVER KIT B (KITS) ×1 IMPLANT
NDL 18GX1X1/2 (RX/OR ONLY) (NEEDLE) ×1 IMPLANT
NEEDLE 18GX1X1/2 (RX/OR ONLY) (NEEDLE) ×1 IMPLANT
PACK CV ACCESS (CUSTOM PROCEDURE TRAY) ×1 IMPLANT
PAD ARMBOARD POSITIONER FOAM (MISCELLANEOUS) ×2 IMPLANT
SLING ARM FOAM STRAP LRG (SOFTGOODS) IMPLANT
SOLN 0.9% NACL POUR BTL 1000ML (IV SOLUTION) ×1 IMPLANT
SOLN STERILE WATER BTL 1000 ML (IV SOLUTION) ×1 IMPLANT
SPONGE T-LAP 18X18 ~~LOC~~+RFID (SPONGE) IMPLANT
SURGIFLO W/THROMBIN 8M KIT (HEMOSTASIS) IMPLANT
SUT PROLENE 5 0 C 1 24 (SUTURE) IMPLANT
SUT PROLENE 6 0 BV (SUTURE) ×1 IMPLANT
SUT SILK 2 0 SH (SUTURE) IMPLANT
SUT VIC AB 3-0 SH 27X BRD (SUTURE) ×1 IMPLANT
SUT VIC AB 4-0 PS2 18 (SUTURE) ×1 IMPLANT
SYR 30ML LL (SYRINGE) ×1 IMPLANT
TOWEL GREEN STERILE (TOWEL DISPOSABLE) ×1 IMPLANT
UNDERPAD 30X36 HEAVY ABSORB (UNDERPADS AND DIAPERS) ×1 IMPLANT

## 2024-08-07 NOTE — Transfer of Care (Signed)
 Immediate Anesthesia Transfer of Care Note  Patient: Melvin Carney  Procedure(s) Performed: TRANSPOSITION, VEIN, BASILIC (Left)  Patient Location: PACU  Anesthesia Type:General  Level of Consciousness: awake  Airway & Oxygen Therapy: Patient Spontanous Breathing and Patient connected to nasal cannula oxygen  Post-op Assessment: Report given to RN and Post -op Vital signs reviewed and stable  Post vital signs: Reviewed and stable  Last Vitals:  Vitals Value Taken Time  BP 129/91 08/07/24 15:36  Temp 36.8 C 08/07/24 15:36  Pulse 65 08/07/24 15:39  Resp 20 08/07/24 15:39  SpO2 97 % 08/07/24 15:39  Vitals shown include unfiled device data.  Last Pain:  Vitals:   08/07/24 1119  TempSrc:   PainSc: 0-No pain         Complications: No notable events documented.

## 2024-08-07 NOTE — Progress Notes (Addendum)
 Notified Dr. Tilford MD that patient is on home O2 and did not bring O2 to the hospital with him. Patient O2 sats drop to 88% on room air. Notified Dr. Tilford that patient would also have intermittent tachycardia to 150's and then decrease to baseline HR 60s-80s. Pt stated he has not taken his carvedilol  today. Duo nebulizer was given in PACU and patient was cleared for discharge by Dr Tilford MD. Patient was instructed to immediately restart home O2 upon arrival home and was told to take his carvedilol  as well per Dr. Tilford MD. Patient's daughter was called and given this information as well.

## 2024-08-07 NOTE — Progress Notes (Signed)
 [facility] transportation to take patient home after surgery.  He lives with his daughter who will be with him.  [facility] number [phone]

## 2024-08-07 NOTE — Anesthesia Postprocedure Evaluation (Signed)
 Anesthesia Post Note  Patient: Melvin Carney  Procedure(s) Performed: TRANSPOSITION, VEIN, BASILIC (Left)     Patient location during evaluation: PACU Anesthesia Type: General Level of consciousness: awake Pain management: pain level controlled Vital Signs Assessment: post-procedure vital signs reviewed and stable Respiratory status: spontaneous breathing Cardiovascular status: blood pressure returned to baseline Postop Assessment: no apparent nausea or vomiting Anesthetic complications: no   No notable events documented.  Last Vitals:  Vitals:   08/07/24 1700 08/07/24 1715  BP: 127/76 123/79  Pulse: 62 69  Resp: 13 18  Temp:  36.4 C  SpO2: 100% 100%    Last Pain:  Vitals:   08/07/24 1715  TempSrc:   PainSc: 3                  Lauraine KATHEE [place]

## 2024-08-07 NOTE — Discharge Instructions (Addendum)
   [facility]  Discharge Instructions  AV Fistula or Graft Surgery for Dialysis Access  Please refer to the following instructions for your post-procedure care. Your surgeon or physician assistant will discuss any changes with you.  Activity  You may drive the day following your surgery, if you are comfortable and no longer taking prescription pain medication. Resume full activity as the soreness in your incision resolves.  Bathing/Showering  You may shower after you go home. Keep your incision dry for 48 hours. Do not soak in a bathtub, hot tub, or swim until the incision heals completely. You may not shower if you have a hemodialysis catheter.  Incision Care  Clean your incision with mild soap and water after 48 hours. Pat the area dry with a clean towel. You do not need a bandage unless otherwise instructed. Do not apply any ointments or creams to your incision. You may have skin glue on your incision. Do not peel it off. It will come off on its own in about one week. Your arm may swell a bit after surgery. To reduce swelling use pillows to elevate your arm so it is above your heart. Your doctor will tell you if you need to lightly wrap your arm with an ACE bandage.  Diet  Resume your normal diet. There are not special food restrictions following this procedure. In order to heal from your surgery, it is CRITICAL to get adequate nutrition. Your body requires vitamins, minerals, and protein. Vegetables are the best source of vitamins and minerals. Vegetables also provide the perfect balance of protein. Processed food has little nutritional value, so try to avoid this.  Medications  Resume taking all of your medications. If your incision is causing pain, you may take over-the counter pain relievers such as acetaminophen  (Tylenol ). If you were prescribed a stronger pain medication, please be aware these medications can cause nausea and constipation. Prevent  nausea by taking the medication with a snack or meal. Avoid constipation by drinking plenty of fluids and eating foods with high amount of fiber, such as fruits, vegetables, and grains.  Do not take Tylenol  if you are taking prescription pain medications.  Follow up Your surgeon may want to see you in the office following your access surgery. If so, this will be arranged at the time of your surgery.  Please call us  immediately for any of the following conditions:  Increased pain, redness, drainage (pus) from your incision site Fever of 101 degrees or higher Severe or worsening pain at your incision site Hand pain or numbness.  Reduce your risk of vascular disease:  Stop smoking. If you would like help, call [facility] at 1-800-QUIT-NOW ((289)544-1201) or [place] at 6602602276  Manage your cholesterol Maintain a desired weight Control your diabetes Keep your blood pressure down  Dialysis  It will take several weeks to several months for your new dialysis access to be ready for use. Your surgeon will determine when it is okay to use it. Your nephrologist will continue to direct your dialysis. You can continue to use your Permcath until your new access is ready for use.   08/07/2024 Melvin Carney [id number] 08-Apr-1951  Surgeon(s): Melvin Carney ORN, MD  Procedure(s): TRANSPOSITION, VEIN, BASILIC   May stick graft immediately   May stick graft on designated area only:   x Do not stick AVF for 4 weeks    If you have any questions, please call the office at [phone].

## 2024-08-07 NOTE — Anesthesia Procedure Notes (Signed)
 Procedure Name: LMA Insertion Date/Time: 08/07/2024 1:33 PM  Performed by: Virgil Ee, CRNAPre-anesthesia Checklist: Patient identified, Patient being monitored, Timeout performed, Emergency Drugs available and Suction available Patient Re-evaluated:Patient Re-evaluated prior to induction Oxygen Delivery Method: Circle system utilized Preoxygenation: Pre-oxygenation with 100% oxygen Induction Type: IV induction Ventilation: Mask ventilation without difficulty LMA: LMA inserted LMA Size: 4.0 Tube type: Oral Number of attempts: 1 Placement Confirmation: positive ETCO2 and breath sounds checked- equal and bilateral Tube secured with: Tape Dental Injury: Teeth and Oropharynx as per pre-operative assessment

## 2024-08-07 NOTE — Op Note (Signed)
    Patient name: Melvin Carney MRN: [id number] DOB: 1951/06/10 Sex: male  08/07/2024 Pre-operative Diagnosis: ESRD Post-operative diagnosis:  Same Surgeon:  [name] Assistants:  Adina Sender, PA Procedure:   left basilic vein fistula revision (2nd stage) Anesthesia:  General Blood Loss:  200 cc Specimens:  none  Findings: Excellent basilic vein measuring 6 to 7 mm.  Indications: This is a 73 year old gentleman who has previously undergone a first stage left basilic vein fistula creation.  He comes in today for second stage  Procedure:  The patient was identified in the holding area and taken to [facility] OR ROOM 11  The patient was then placed supine on the table. general anesthesia was administered.  The patient was prepped and draped in the usual sterile fashion.  A time out was called and antibiotics were administered.  A PA was necessary to expedite the procedure and assist with technical details.  He helped with exposure by providing suction and retraction.  He helped with the anastomosis by following the suture.  He helped with wound closure.  2 longitudinal incisions were made over top of the basilic vein in the left upper arm.  Cautery was used to divide subcutaneous tissue down to the vein which was circumferentially exposed from the antecubital crease up to the axilla.  The nerve was protected.  Multiple side branches were ligated between silk ties.  A pledgeted repair was required after a rent was made in the valve along the vein up near the axilla.  Once the vein was fully mobilized it was marked for orientation and then divided near the antecubital crease.  A subcutaneous tunnel was then created with a curved Gore tunneler.  Prior to tunneling Exparel was placed in the tunnel.  Next, the vein was brought through the tunnel making sure to maintain proper orientation.  I then performed a end-to-end anastomosis with a running 5-0 Prolene.  Prior to completion the appropriate flushing  maneuvers were performed and the anastomosis was completed.  There was an excellent thrill in the fistula after the clamps were released.  Next, the wound was irrigated.  Hemostasis was achieved.  The deep tissue was reapproximated with running 3-0 Vicryl and the skin was closed with 4-0 Vicryl followed by Dermabond.  Sterile dressings were applied.  There were no immediate complications.  The patient was successfully extubated and taken recovery stable addition.   Disposition: To PACU stable   V. [name], M.D., [facility] [place] Office: [phone] Pager:  [phone]

## 2024-08-07 NOTE — Interval H&P Note (Signed)
 History and Physical Interval Note:  08/07/2024 12:57 PM  Melvin Carney  has presented today for surgery, with the diagnosis of ESRD.  The various methods of treatment have been discussed with the patient and family. After consideration of risks, benefits and other options for treatment, the patient has consented to  Procedure(s): TRANSPOSITION, VEIN, BASILIC (Left) as a surgical intervention.  The patient's history has been reviewed, patient examined, no change in status, stable for surgery.  I have reviewed the patient's chart and labs.  Questions were answered to the patient's satisfaction.     [name]

## 2024-08-08 ENCOUNTER — Encounter (HOSPITAL_COMMUNITY): Payer: Self-pay | Admitting: Surgery

## 2024-09-02 ENCOUNTER — Encounter (INDEPENDENT_AMBULATORY_CARE_PROVIDER_SITE_OTHER): Payer: Self-pay | Admitting: *Deleted

## 2024-10-13 ENCOUNTER — Encounter (INDEPENDENT_AMBULATORY_CARE_PROVIDER_SITE_OTHER): Payer: Self-pay | Admitting: *Deleted
